# Patient Record
Sex: Male | Born: 1965 | Race: White | Hispanic: No | Smoking: Current every day smoker
Health system: Southern US, Community
[De-identification: ages and names within clinical notes are randomized; demographics above are authoritative.]

## PROBLEM LIST (undated history)

## (undated) DIAGNOSIS — K920 Hematemesis: Secondary | ICD-10-CM

## (undated) DIAGNOSIS — K469 Unspecified abdominal hernia without obstruction or gangrene: Secondary | ICD-10-CM

## (undated) DIAGNOSIS — F32A Depression, unspecified: Secondary | ICD-10-CM

## (undated) DIAGNOSIS — K766 Portal hypertension: Secondary | ICD-10-CM

## (undated) DIAGNOSIS — E785 Hyperlipidemia, unspecified: Secondary | ICD-10-CM

## (undated) DIAGNOSIS — F909 Attention-deficit hyperactivity disorder, unspecified type: Secondary | ICD-10-CM

## (undated) DIAGNOSIS — R0602 Shortness of breath: Secondary | ICD-10-CM

## (undated) DIAGNOSIS — K7460 Unspecified cirrhosis of liver: Secondary | ICD-10-CM

## (undated) DIAGNOSIS — N200 Calculus of kidney: Secondary | ICD-10-CM

## (undated) DIAGNOSIS — Z87442 Personal history of urinary calculi: Secondary | ICD-10-CM

## (undated) DIAGNOSIS — R319 Hematuria, unspecified: Secondary | ICD-10-CM

## (undated) DIAGNOSIS — K921 Melena: Secondary | ICD-10-CM

## (undated) DIAGNOSIS — D61818 Other pancytopenia: Secondary | ICD-10-CM

## (undated) DIAGNOSIS — E119 Type 2 diabetes mellitus without complications: Secondary | ICD-10-CM

## (undated) DIAGNOSIS — J189 Pneumonia, unspecified organism: Secondary | ICD-10-CM

## (undated) DIAGNOSIS — R519 Headache, unspecified: Secondary | ICD-10-CM

## (undated) DIAGNOSIS — K279 Peptic ulcer, site unspecified, unspecified as acute or chronic, without hemorrhage or perforation: Secondary | ICD-10-CM

## (undated) DIAGNOSIS — R7303 Prediabetes: Secondary | ICD-10-CM

## (undated) DIAGNOSIS — I8500 Esophageal varices without bleeding: Secondary | ICD-10-CM

## (undated) DIAGNOSIS — I81 Portal vein thrombosis: Secondary | ICD-10-CM

## (undated) DIAGNOSIS — D376 Neoplasm of uncertain behavior of liver, gallbladder and bile ducts: Secondary | ICD-10-CM

## (undated) DIAGNOSIS — F419 Anxiety disorder, unspecified: Secondary | ICD-10-CM

## (undated) HISTORY — DX: Neoplasm of uncertain behavior of liver, gallbladder and bile ducts: D37.6

## (undated) HISTORY — DX: Unspecified cirrhosis of liver: K74.60

## (undated) HISTORY — DX: Anxiety disorder, unspecified: F41.9

## (undated) HISTORY — DX: Calculus of kidney: N20.0

## (undated) HISTORY — DX: Depression, unspecified: F32.A

## (undated) HISTORY — DX: Melena: K92.1

## (undated) HISTORY — PX: URETHRAL STRICTURE DILATATION: SHX477

## (undated) HISTORY — DX: Pneumonia, unspecified organism: J18.9

## (undated) HISTORY — PX: CIRCUMCISION: SUR203

## (undated) HISTORY — DX: Peptic ulcer, site unspecified, unspecified as acute or chronic, without hemorrhage or perforation: K27.9

## (undated) HISTORY — DX: Other pancytopenia: D61.818

## (undated) HISTORY — DX: Unspecified abdominal hernia without obstruction or gangrene: K46.9

## (undated) HISTORY — PX: TONSILLECTOMY: SUR1361

## (undated) HISTORY — DX: Shortness of breath: R06.02

## (undated) HISTORY — PX: OTHER SURGICAL HISTORY: SHX169

## (undated) HISTORY — DX: Type 2 diabetes mellitus without complications: E11.9

## (undated) HISTORY — DX: Attention-deficit hyperactivity disorder, unspecified type: F90.9

## (undated) HISTORY — DX: Portal hypertension: K76.6

## (undated) HISTORY — DX: Headache, unspecified: R51.9

## (undated) HISTORY — DX: Hematuria, unspecified: R31.9

## (undated) HISTORY — DX: Hematemesis: K92.0

## (undated) HISTORY — DX: Portal vein thrombosis: I81

## (undated) HISTORY — DX: Hyperlipidemia, unspecified: E78.5

---

## 2021-04-05 DIAGNOSIS — E663 Overweight: Secondary | ICD-10-CM | POA: Diagnosis not present

## 2021-04-05 DIAGNOSIS — E785 Hyperlipidemia, unspecified: Secondary | ICD-10-CM | POA: Diagnosis not present

## 2021-04-05 DIAGNOSIS — K439 Ventral hernia without obstruction or gangrene: Secondary | ICD-10-CM | POA: Diagnosis not present

## 2021-04-05 DIAGNOSIS — E1169 Type 2 diabetes mellitus with other specified complication: Secondary | ICD-10-CM | POA: Diagnosis not present

## 2021-05-20 DIAGNOSIS — R161 Splenomegaly, not elsewhere classified: Secondary | ICD-10-CM | POA: Diagnosis not present

## 2021-05-20 DIAGNOSIS — R160 Hepatomegaly, not elsewhere classified: Secondary | ICD-10-CM | POA: Diagnosis not present

## 2021-05-20 DIAGNOSIS — R188 Other ascites: Secondary | ICD-10-CM | POA: Diagnosis not present

## 2021-05-20 DIAGNOSIS — K7689 Other specified diseases of liver: Secondary | ICD-10-CM | POA: Diagnosis not present

## 2021-05-20 DIAGNOSIS — K7460 Unspecified cirrhosis of liver: Secondary | ICD-10-CM | POA: Diagnosis not present

## 2021-06-01 DIAGNOSIS — R188 Other ascites: Secondary | ICD-10-CM | POA: Diagnosis not present

## 2021-06-01 DIAGNOSIS — I81 Portal vein thrombosis: Secondary | ICD-10-CM | POA: Diagnosis not present

## 2021-06-01 DIAGNOSIS — R748 Abnormal levels of other serum enzymes: Secondary | ICD-10-CM | POA: Diagnosis not present

## 2021-06-01 DIAGNOSIS — K7469 Other cirrhosis of liver: Secondary | ICD-10-CM | POA: Diagnosis not present

## 2021-06-01 DIAGNOSIS — Z1159 Encounter for screening for other viral diseases: Secondary | ICD-10-CM | POA: Diagnosis not present

## 2021-06-01 DIAGNOSIS — D376 Neoplasm of uncertain behavior of liver, gallbladder and bile ducts: Secondary | ICD-10-CM | POA: Diagnosis not present

## 2021-06-07 ENCOUNTER — Encounter: Payer: Self-pay | Admitting: Gastroenterology

## 2021-06-15 DIAGNOSIS — R188 Other ascites: Secondary | ICD-10-CM | POA: Diagnosis not present

## 2021-06-15 DIAGNOSIS — D61818 Other pancytopenia: Secondary | ICD-10-CM | POA: Diagnosis not present

## 2021-06-15 DIAGNOSIS — K7469 Other cirrhosis of liver: Secondary | ICD-10-CM | POA: Diagnosis not present

## 2021-06-17 ENCOUNTER — Encounter: Payer: Self-pay | Admitting: Gastroenterology

## 2021-06-24 ENCOUNTER — Ambulatory Visit: Payer: Self-pay | Admitting: Gastroenterology

## 2021-07-01 ENCOUNTER — Other Ambulatory Visit: Payer: Self-pay | Admitting: Nurse Practitioner

## 2021-07-01 DIAGNOSIS — D376 Neoplasm of uncertain behavior of liver, gallbladder and bile ducts: Secondary | ICD-10-CM

## 2021-07-11 ENCOUNTER — Other Ambulatory Visit: Payer: Self-pay

## 2021-07-12 NOTE — Progress Notes (Deleted)
[facility]  [address] [place],  [place]  [address] [phone]  Clinic Day:  07/12/2021  Referring physician: Emmaline Kluver, *  ASSESSMENT & PLAN:   Assessment & Plan: No problem-specific Assessment & Plan notes found for this encounter.    The patient understands the plans discussed today and is in agreement with them.  He knows to contact our office if he develops concerns prior to his next appointment.   I provided *** minutes of face-to-face time during this encounter and > 50% was spent counseling as documented under my assessment and plan.    Melodye Ped, NP  [facility] [address] [place] [address] Dept: [phone] Dept Fax: [phone]   No orders of the defined types were placed in this encounter.     CHIEF COMPLAINT:  CC: ***  Current Treatment:  ***  HISTORY OF PRESENT ILLNESS:   Oncology History   No history exists.      INTERVAL HISTORY:  Colton Leon is here today for repeat clinical assessment. He denies fevers or chills. He denies pain. His appetite is good. His weight {Weight change:10426}.  REVIEW OF SYSTEMS:  Review of Systems - Oncology   VITALS:  There were no vitals taken for this visit.  Wt Readings from Last 3 Encounters:  No data found for Wt    There is no height or weight on file to calculate BMI.  Performance status (ECOG): {CHL ONC [id number]  PHYSICAL EXAM:  Physical Exam  LABS:  No flowsheet data found. No flowsheet data found.   No results found for: CEA1 / No results found for: CEA1 No results found for: PSA1 No results found for: SEG315 No results found for: CAN125  No results found for: TOTALPROTELP, ALBUMINELP, A1GS, A2GS, BETS, BETA2SER, GAMS, MSPIKE, SPEI No results found for: TIBC, FERRITIN, IRONPCTSAT No results found for: LDH  STUDIES:  No results found.    HISTORY:   Past  Medical History:  Diagnosis Date   Cirrhosis ([facility])    Hyperlipidemia    Neoplasm of uncertain behavior of liver    Portal hypertension ([facility])    Portal vein thrombosis    Type 2 diabetes mellitus ([facility])     *** The histories are not reviewed yet. Please review them in the "History" navigator section and refresh this [place].  Family History  Problem Relation Age of Onset   Hypertension Mother    Skin cancer Father    Emphysema Father    Diabetes Sister    Hypertension Sister     Social History:  has no history on file for tobacco use, alcohol use, and drug use.The patient is {Blank single:19197::"alone","accompanied by"} *** today.  Allergies:  Allergies  Allergen Reactions   Amoxicillin-Pot Clavulanate Shortness Of Breath   Shellfish Allergy Shortness Of Breath    Current Medications: Current Outpatient Medications  Medication Sig Dispense Refill   acetaminophen (TYLENOL) 325 MG tablet Take 650 mg by mouth every 6 (six) hours as needed.     ferrous sulfate 325 (65 FE) MG tablet Take by mouth.     furosemide (LASIX) 20 MG tablet Take by mouth.     spironolactone (ALDACTONE) 50 MG tablet Take by mouth.     traMADol (ULTRAM) 50 MG tablet Take by mouth every 6 (six) hours as needed.     No current facility-administered medications for this visit.

## 2021-07-12 NOTE — Progress Notes (Cosign Needed)
[facility]  [address] [place],  [place]  [address] [phone]  Clinic Day:  07/12/2021  Referring physician: Emmaline Kluver, *   REASON FOR CONSULTATION:  Pancytopenia  HISTORY OF PRESENT ILLNESS:  Colton Leon is a 56 y.o. male with a history of pancytopenia who is referred in consultation by Christa See for assessment and management. Colton Leon originally presented in November 2022 for a work related abdominal injury. He was evaluated by ultrasound which revealed recanalization of the umbilical vein with nonocclusive thrombus of the portal splenic confluence, splenomegaly and trace ascites. CT of the abdomen revealed no findings related to injury, but reported a cirrhotic appearance to the liver with evidence of portal hypertension including splenomegaly and paraesophageal varices, concerning for nonocclusive portal vein thrombus, small amount of abdominal ascites, and subcentimeter lesion within the right hepatic lobe of the liver.   He then underwent MRI of the abdomen with and without contrast on 05/20/2021 and was noted to have a 2.7 cm lesion within segment 4 of the liver with early arterial enhancement and late phase suggestion of washout and pseudocapsule although it is noted that the imaging was of poor quality, the lesion was highly concerning for hepatocellular carcinoma. Recommendation was for repeat MRI imaging. Unfortunately, he lost insurance around this time and was unable to pursue further imaging.   He established insurance and was evaluated by [facility] in [place]. Serologic evaluation was negative for viral, autoimmune, or genetic liver disease. He has no immunity to hepatitis A or B. He was also noted to have pancytopenia with WBC 1.8 and Hgb 7.3. He was noted to have iron deficiency anemia with ferritin of 5 and saturation of 10%. He was started on oral iron at that time. AFP in January  2023 was 3.3, indirect bilirubin 1.6, INR of 1.2 with alkaline phosphatase/ AST/ ALT within normal limits. In response to his ascites, he was started on furosemide 40 mg daily and spironolactone 100 mg daily.   Today he presents with his girlfriend for continued evaluation. He denies fever, chills, nausea or vomiting. He denies abdominal pain or increase in ascites. He denies issue with bowel or bladder. Medical history is significant for blood in stool, ulcer, type 2 diabetes, hyperlipidemia, other cirrhosis of liver, ascites, portal hypertension and portal vein thrombosis. Past surgical history consists of a tonsillectomy. Family history is significant for hypertension, skin cancer, bipolar disorder, emphysema, diabetes and mental health disorder. He is currently unemployed having just lost his job. He worked as a Administrator. He smokes daily 1-1/2 packs per day. He denies alcohol or substance use. He is divorced with 2 children and lives with his girlfriend.    REVIEW OF SYSTEMS:  Review of Systems  Constitutional:  Negative for appetite change, chills, diaphoresis, fatigue, fever and unexpected weight change.  HENT:   Negative for hearing loss, lump/mass, mouth sores, nosebleeds, sore throat, tinnitus, trouble swallowing and voice change.   Eyes:  Negative for eye problems and icterus.  Respiratory:  Negative for chest tightness, cough, hemoptysis, shortness of breath and wheezing.   Cardiovascular:  Negative for chest pain, leg swelling and palpitations.  Gastrointestinal:  Positive for abdominal distention. Negative for abdominal pain, blood in stool, constipation, diarrhea, nausea, rectal pain and vomiting.  Endocrine: Negative for hot flashes.  Genitourinary:  Negative for bladder incontinence, difficulty urinating, dyspareunia, dysuria, frequency, hematuria and nocturia.   Musculoskeletal:  Negative for arthralgias, back pain, flank  pain, gait problem, myalgias, neck pain and neck stiffness.   Skin:  Negative for itching, rash and wound.  Neurological:  Negative for dizziness, extremity weakness, gait problem, headaches, light-headedness, numbness, seizures and speech difficulty.  Hematological:  Negative for adenopathy. Does not bruise/bleed easily.  Psychiatric/Behavioral:  Negative for confusion, decreased concentration, depression, sleep disturbance and suicidal ideas. The patient is not nervous/anxious.     VITALS:  There were no vitals taken for this visit.  Wt Readings from Last 3 Encounters:  No data found for Wt    There is no height or weight on file to calculate BMI.  Performance status (ECOG): 1 - Symptomatic but completely ambulatory  PHYSICAL EXAM:  Physical Exam Constitutional:      General: He is not in acute distress.    Appearance: Normal appearance. He is normal weight. He is not ill-appearing, toxic-appearing or diaphoretic.  HENT:     Head: Normocephalic and atraumatic.     Right Ear: Tympanic membrane normal.     Left Ear: Tympanic membrane normal.     Nose: Nose normal. No congestion or rhinorrhea.     Mouth/Throat:     Mouth: Mucous membranes are moist.     Pharynx: Oropharynx is clear. No oropharyngeal exudate or posterior oropharyngeal erythema.  Eyes:     General: No scleral icterus.       Right eye: No discharge.        Left eye: No discharge.     Extraocular Movements: Extraocular movements intact.     Conjunctiva/sclera: Conjunctivae normal.     Pupils: Pupils are equal, round, and reactive to light.  Neck:     Vascular: No carotid bruit.  Cardiovascular:     Rate and Rhythm: Normal rate and regular rhythm.     Heart sounds: No murmur heard.   No friction rub. No gallop.  Pulmonary:     Effort: Pulmonary effort is normal. No respiratory distress.     Breath sounds: Normal breath sounds. No stridor. No wheezing, rhonchi or rales.  Chest:     Chest wall: No tenderness.  Abdominal:     General: Abdomen is flat. Bowel sounds are  normal. There is distension.     Palpations: There is no mass.     Tenderness: There is no abdominal tenderness. There is no right CVA tenderness, left CVA tenderness, guarding or rebound.     Hernia: No hernia is present.  Musculoskeletal:        General: No swelling, tenderness, deformity or signs of injury. Normal range of motion.     Cervical back: Normal range of motion and neck supple. No rigidity or tenderness.     Right lower leg: No edema.     Left lower leg: No edema.  Lymphadenopathy:     Cervical: No cervical adenopathy.  Skin:    General: Skin is warm and dry.     Capillary Refill: Capillary refill takes less than 2 seconds.     Coloration: Skin is not jaundiced or pale.     Findings: No bruising, erythema, lesion or rash.  Neurological:     General: No focal deficit present.     Mental Status: He is alert and oriented to person, place, and time. Mental status is at baseline.     Cranial Nerves: No cranial nerve deficit.     Sensory: No sensory deficit.     Motor: No weakness.     Coordination: Coordination normal.     Gait:  Gait normal.     Deep Tendon Reflexes: Reflexes normal.  Psychiatric:        Mood and Affect: Mood normal.        Behavior: Behavior normal.        Thought Content: Thought content normal.        Judgment: Judgment normal.     LABS:  No flowsheet data found. No flowsheet data found.   No results found for: CEA1 / No results found for: CEA1 No results found for: PSA1 No results found for: [id number] No results found for: CAN125  No results found for: TOTALPROTELP, ALBUMINELP, A1GS, A2GS, BETS, BETA2SER, GAMS, MSPIKE, SPEI No results found for: TIBC, FERRITIN, IRONPCTSAT No results found for: LDH  STUDIES:  No results found.    HISTORY:   Past Medical History:  Diagnosis Date   Cirrhosis ([facility])    Hyperlipidemia    Neoplasm of uncertain behavior of liver    Portal hypertension ([facility])    Portal vein thrombosis    Type 2 diabetes  mellitus ([facility])       Family History  Problem Relation Age of Onset   Hypertension Mother    Skin cancer Father    Emphysema Father    Diabetes Sister    Hypertension Sister     Social History:  has no history on file for tobacco use, alcohol use, and drug use.The patient is accompanied by girlfriend today.  Allergies:  Allergies  Allergen Reactions   Amoxicillin-Pot Clavulanate Shortness Of Breath   Shellfish Allergy Shortness Of Breath    Current Medications: Current Outpatient Medications  Medication Sig Dispense Refill   acetaminophen (TYLENOL) 325 MG tablet Take 650 mg by mouth every 6 (six) hours as needed.     ferrous sulfate 325 (65 FE) MG tablet Take by mouth.     furosemide (LASIX) 20 MG tablet Take by mouth.     spironolactone (ALDACTONE) 50 MG tablet Take by mouth.     traMADol (ULTRAM) 50 MG tablet Take by mouth every 6 (six) hours as needed.     No current facility-administered medications for this visit.     ASSESSMENT & PLAN:   Assessment:  Colton Leon is a 56 y.o. male with history of pancytopenia and liver lesion. Today, his hemoglobin has improved from 7.3 to 9.1 on oral iron. He remains with microcytic anemia today. White count remains 1.8 and platelets are 53 today. He was recommended for repeat MRI of the abdomen for better evaluation of liver lesion and this is scheduled on 03/07 as well as an EGD. We discussed the many causes of his low blood counts including anemia, liver disease or bone marrow dysfunction. If MRI of the abdomen indicates a concern for malignancy, the next step would be biopsy. We also discussed that at some point his bone marrow may require evaluation. Both the patient and his girlfriend understand that, unfortunately we still have a lot of unanswered questions. They are anxious to continue with diagnostics and move toward a treatment plan.   Plan: 1.  We will wait for results of upcoming imaging and he will return to clinic  in 3 weeks for discussion of next steps in treatment planning.   I discussed the assessment and treatment plan with the patient.  The patient was provided an opportunity to ask questions and all were answered.  The patient agreed with the plan and demonstrated an understanding of the instructions.  The patient was advised to call back  if the symptoms worsen or if the condition fails to improve as anticipated.  Thank you for the opportunity      Melodye Ped, NP

## 2021-07-13 ENCOUNTER — Encounter: Payer: Self-pay | Admitting: Hematology and Oncology

## 2021-07-13 ENCOUNTER — Encounter (INDEPENDENT_AMBULATORY_CARE_PROVIDER_SITE_OTHER): Payer: Self-pay

## 2021-07-13 ENCOUNTER — Other Ambulatory Visit: Payer: Self-pay

## 2021-07-13 ENCOUNTER — Inpatient Hospital Stay: Payer: Managed Care, Other (non HMO)

## 2021-07-13 ENCOUNTER — Other Ambulatory Visit: Payer: Self-pay | Admitting: Hematology and Oncology

## 2021-07-13 ENCOUNTER — Inpatient Hospital Stay: Payer: Managed Care, Other (non HMO) | Attending: Hematology and Oncology | Admitting: Hematology and Oncology

## 2021-07-13 VITALS — BP 125/61 | HR 96 | Resp 20 | Ht 66.0 in | Wt 169.2 lb

## 2021-07-13 DIAGNOSIS — K769 Liver disease, unspecified: Secondary | ICD-10-CM | POA: Diagnosis present

## 2021-07-13 DIAGNOSIS — D61818 Other pancytopenia: Secondary | ICD-10-CM | POA: Diagnosis not present

## 2021-07-13 DIAGNOSIS — R161 Splenomegaly, not elsewhere classified: Secondary | ICD-10-CM | POA: Diagnosis not present

## 2021-07-13 DIAGNOSIS — E119 Type 2 diabetes mellitus without complications: Secondary | ICD-10-CM

## 2021-07-13 DIAGNOSIS — I81 Portal vein thrombosis: Secondary | ICD-10-CM

## 2021-07-13 DIAGNOSIS — K7460 Unspecified cirrhosis of liver: Secondary | ICD-10-CM

## 2021-07-13 DIAGNOSIS — K766 Portal hypertension: Secondary | ICD-10-CM

## 2021-07-13 LAB — HEPATIC FUNCTION PANEL
ALT: 22 (ref 10–40)
AST: 37 (ref 14–40)
Alkaline Phosphatase: 80 (ref 25–125)
Bilirubin, Total: 1.6

## 2021-07-13 LAB — CBC: RBC: 3.73 — AB (ref 3.87–5.11)

## 2021-07-13 LAB — BASIC METABOLIC PANEL
BUN: 16 (ref 4–21)
CO2: 28 — AB (ref 13–22)
Chloride: 101 (ref 99–108)
Creatinine: 0.6 (ref 0.6–1.3)
Glucose: 297
Potassium: 4.3 (ref 3.4–5.3)
Sodium: 137 (ref 137–147)

## 2021-07-13 LAB — IRON AND TIBC
Iron: 231 ug/dL — ABNORMAL HIGH (ref 45–182)
Saturation Ratios: 47 % — ABNORMAL HIGH (ref 17.9–39.5)
TIBC: 495 ug/dL — ABNORMAL HIGH (ref 250–450)
UIBC: 264 ug/dL

## 2021-07-13 LAB — CBC AND DIFFERENTIAL
HCT: 29 — AB (ref 41–53)
Hemoglobin: 9.1 — AB (ref 13.5–17.5)
Neutrophils Absolute: 0.98
Platelets: 53 — AB (ref 150–399)
WBC: 1.6

## 2021-07-13 LAB — FERRITIN: Ferritin: 7 ng/mL — ABNORMAL LOW (ref 24–336)

## 2021-07-13 LAB — COMPREHENSIVE METABOLIC PANEL
Albumin: 4.1 (ref 3.5–5.0)
Calcium: 8.7 (ref 8.7–10.7)

## 2021-07-13 LAB — FOLATE: Folate: 9.9 ng/mL (ref >5.9)

## 2021-07-13 LAB — VITAMIN B12: Vitamin B-12: 567 pg/mL (ref 180–914)

## 2021-07-25 NOTE — Progress Notes (Incomplete)
[facility]  [address] [place],  [place]  [address] [phone]  Clinic Day:  07/25/2021  Referring physician: Venetia Maxon, [place], *  This document serves as a record of services personally performed by Marice Potter, MD. It was created on their behalf by Curry,Lauren E, a trained medical scribe. The creation of this record is based on the scribe's personal observations and the provider's statements to them.  REASON FOR CONSULTATION:  Pancytopenia  HISTORY OF PRESENT ILLNESS:  Colton Leon is a 56 y.o. male with a history of pancytopenia who is referred in consultation by Christa See for assessment and management. Colton Leon originally presented in November 2022 for a work related abdominal injury. He was evaluated by ultrasound which revealed recanalization of the umbilical vein with nonocclusive thrombus of the portal splenic confluence, splenomegaly and trace ascites. CT of the abdomen revealed no findings related to injury, but reported a cirrhotic appearance to the liver with evidence of portal hypertension including splenomegaly and paraesophageal varices, concerning for nonocclusive portal vein thrombus, small amount of abdominal ascites, and subcentimeter lesion within the right hepatic lobe of the liver.   He then underwent MRI of the abdomen with and without contrast on 05/20/2021 and was noted to have a 2.7 cm lesion within segment 4 of the liver with early arterial enhancement and late phase suggestion of washout and pseudocapsule although it is noted that the imaging was of poor quality, the lesion was highly concerning for hepatocellular carcinoma. Recommendation was for repeat MRI imaging. Unfortunately, he lost insurance around this time and was unable to pursue further imaging.   He established insurance and was evaluated by [facility] in [place]. Serologic evaluation was negative for viral,  autoimmune, or genetic liver disease. He has no immunity to hepatitis A or B. He was also noted to have pancytopenia with WBC 1.8 and Hgb 7.3. He was noted to have iron deficiency anemia with ferritin of 5 and saturation of 10%. He was started on oral iron at that time. AFP in January 2023 was 3.3, indirect bilirubin 1.6, INR of 1.2 with alkaline phosphatase/ AST/ ALT within normal limits. In response to his ascites, he was started on furosemide 40 mg daily and spironolactone 100 mg daily.   Today he presents with his girlfriend for continued evaluation. He denies fever, chills, nausea or vomiting. He denies abdominal pain or increase in ascites. He denies issue with bowel or bladder. Medical history is significant for blood in stool, ulcer, type 2 diabetes, hyperlipidemia, other cirrhosis of liver, ascites, portal hypertension and portal vein thrombosis. Past surgical history consists of a tonsillectomy. Family history is significant for hypertension, skin cancer, bipolar disorder, emphysema, diabetes and mental health disorder. He is currently unemployed having just lost his job. He worked as a Administrator. He smokes daily 1-1/2 packs per day. He denies alcohol or substance use. He is divorced with 2 children and lives with his girlfriend.    REVIEW OF SYSTEMS:  Review of Systems  Constitutional:  Negative for appetite change, chills, diaphoresis, fatigue, fever and unexpected weight change.  HENT:   Negative for hearing loss, lump/mass, mouth sores, nosebleeds, sore throat, tinnitus, trouble swallowing and voice change.   Eyes:  Negative for eye problems and icterus.  Respiratory:  Negative for chest tightness, cough, hemoptysis, shortness of breath and wheezing.   Cardiovascular:  Negative for chest pain, leg swelling and palpitations.  Gastrointestinal:  Positive for abdominal  distention. Negative for abdominal pain, blood in stool, constipation, diarrhea, nausea, rectal pain and vomiting.   Endocrine: Negative for hot flashes.  Genitourinary:  Negative for bladder incontinence, difficulty urinating, dyspareunia, dysuria, frequency, hematuria and nocturia.   Musculoskeletal:  Negative for arthralgias, back pain, flank pain, gait problem, myalgias, neck pain and neck stiffness.  Skin:  Negative for itching, rash and wound.  Neurological:  Negative for dizziness, extremity weakness, gait problem, headaches, light-headedness, numbness, seizures and speech difficulty.  Hematological:  Negative for adenopathy. Does not bruise/bleed easily.  Psychiatric/Behavioral:  Negative for confusion, decreased concentration, depression, sleep disturbance and suicidal ideas. The patient is not nervous/anxious.     VITALS:  There were no vitals taken for this visit.  Wt Readings from Last 3 Encounters:  07/13/21 169 lb 3.2 oz (76.7 kg)    There is no height or weight on file to calculate BMI.  Performance status (ECOG): 1 - Symptomatic but completely ambulatory  PHYSICAL EXAM:  Physical Exam Constitutional:      General: He is not in acute distress.    Appearance: Normal appearance. He is normal weight. He is not ill-appearing, toxic-appearing or diaphoretic.  HENT:     Head: Normocephalic and atraumatic.     Right Ear: Tympanic membrane normal.     Left Ear: Tympanic membrane normal.     Nose: Nose normal. No congestion or rhinorrhea.     Mouth/Throat:     Mouth: Mucous membranes are moist.     Pharynx: Oropharynx is clear. No oropharyngeal exudate or posterior oropharyngeal erythema.  Eyes:     General: No scleral icterus.       Right eye: No discharge.        Left eye: No discharge.     Extraocular Movements: Extraocular movements intact.     Conjunctiva/sclera: Conjunctivae normal.     Pupils: Pupils are equal, round, and reactive to light.  Neck:     Vascular: No carotid bruit.  Cardiovascular:     Rate and Rhythm: Normal rate and regular rhythm.     Heart sounds: No murmur  heard.   No friction rub. No gallop.  Pulmonary:     Effort: Pulmonary effort is normal. No respiratory distress.     Breath sounds: Normal breath sounds. No stridor. No wheezing, rhonchi or rales.  Chest:     Chest wall: No tenderness.  Abdominal:     General: Abdomen is flat. Bowel sounds are normal. There is distension.     Palpations: There is no mass.     Tenderness: There is no abdominal tenderness. There is no right CVA tenderness, left CVA tenderness, guarding or rebound.     Hernia: No hernia is present.  Musculoskeletal:        General: No swelling, tenderness, deformity or signs of injury. Normal range of motion.     Cervical back: Normal range of motion and neck supple. No rigidity or tenderness.     Right lower leg: No edema.     Left lower leg: No edema.  Lymphadenopathy:     Cervical: No cervical adenopathy.  Skin:    General: Skin is warm and dry.     Capillary Refill: Capillary refill takes less than 2 seconds.     Coloration: Skin is not jaundiced or pale.     Findings: No bruising, erythema, lesion or rash.  Neurological:     General: No focal deficit present.     Mental Status: He is alert and  oriented to person, place, and time. Mental status is at baseline.     Cranial Nerves: No cranial nerve deficit.     Sensory: No sensory deficit.     Motor: No weakness.     Coordination: Coordination normal.     Gait: Gait normal.     Deep Tendon Reflexes: Reflexes normal.  Psychiatric:        Mood and Affect: Mood normal.        Behavior: Behavior normal.        Thought Content: Thought content normal.        Judgment: Judgment normal.     LABS:   CBC Latest Ref Rng & Units 07/13/2021  WBC - 1.6  Hemoglobin 13.5 - 17.5 9.1(A)  Hematocrit 41 - 53 29(A)  Platelets 150 - 399 53(A)   CMP Latest Ref Rng & Units 07/13/2021  BUN 4 - 21 16  Creatinine 0.6 - 1.3 0.6  Sodium 137 - 147 137  Potassium 3.4 - 5.3 4.3  Chloride 99 - 108 101  CO2 13 - 22 28(A)   Calcium 8.7 - 10.7 8.7  Alkaline Phos 25 - 125 80  AST 14 - 40 37  ALT 10 - 40 22     No results found for: CEA1 / No results found for: CEA1 No results found for: PSA1 No results found for: OXB353 No results found for: CAN125  No results found for: TOTALPROTELP, ALBUMINELP, A1GS, A2GS, BETS, BETA2SER, GAMS, MSPIKE, SPEI Lab Results  Component Value Date   TIBC 495 (H) 07/13/2021   FERRITIN 7 (L) 07/13/2021   IRONPCTSAT 47 (H) 07/13/2021   No results found for: LDH  STUDIES:  No results found.    HISTORY:   Past Medical History:  Diagnosis Date   ADHD    Anxiety and depression    Blood in stool    hx of   Cirrhosis ([place])    Headache    Hematuria    hx of   Hernia of abdominal cavity    Hyperlipidemia    Neoplasm of uncertain behavior of liver    Neoplasm of uncertain behavior of liver    Pancytopenia ([facility])    Portal hypertension ([facility])    Portal vein thrombosis    Shortness of breath on exertion    Symptom of blood in vomit    hx of   Type 2 diabetes mellitus ([place])       Family History  Problem Relation Age of Onset   Hypertension Mother    Skin cancer Father    Emphysema Father    Bipolar disorder Father    Diabetes Sister    Hypertension Sister    Autism spectrum disorder Daughter     Social History:  reports that he has been smoking cigarettes. He has a 57.00 pack-year smoking history. He has never used smokeless tobacco. He reports that he does not drink alcohol and does not use drugs.The patient is accompanied by girlfriend today.  Allergies:  Allergies  Allergen Reactions   Amoxicillin-Pot Clavulanate Shortness Of Breath   Shellfish Allergy Shortness Of Breath    Current Medications: Current Outpatient Medications  Medication Sig Dispense Refill   acetaminophen (TYLENOL) 325 MG tablet Take 650 mg by mouth every 6 (six) hours as needed.     ferrous sulfate 325 (65 FE) MG tablet Take by mouth.     furosemide (LASIX) 20 MG tablet Take by  mouth.     spironolactone (ALDACTONE) 50 MG  tablet Take by mouth.     traMADol (ULTRAM) 50 MG tablet Take by mouth every 6 (six) hours as needed. (Patient not taking: Reported on 07/13/2021)     No current facility-administered medications for this visit.     ASSESSMENT & PLAN:   Assessment:  Colton Leon is a 56 y.o. male with history of pancytopenia and liver lesion. Today, his hemoglobin has improved from 7.3 to 9.1 on oral iron. He remains with microcytic anemia today. White count remains 1.8 and platelets are 53 today. He was recommended for repeat MRI of the abdomen for better evaluation of liver lesion and this is scheduled on 03/07 as well as an EGD. We discussed the many causes of his low blood counts including anemia, liver disease or bone marrow dysfunction. If MRI of the abdomen indicates a concern for malignancy, the next step would be biopsy. We also discussed that at some point his bone marrow may require evaluation. Both the patient and his girlfriend understand that, unfortunately we still have a lot of unanswered questions. They are anxious to continue with diagnostics and move toward a treatment plan.   Plan: 1.  We will wait for results of upcoming imaging and he will return to clinic in 3 weeks for discussion of next steps in treatment planning.   I discussed the assessment and treatment plan with the patient.  The patient was provided an opportunity to ask questions and all were answered.  The patient agreed with the plan and demonstrated an understanding of the instructions.  The patient was advised to call back if the symptoms worsen or if the condition fails to improve as anticipated.   I, Rita Ohara, am acting as scribe for Marice Potter, MD    I have reviewed this report as typed by the medical scribe, and it is complete and accurate.  Dequincy Macarthur Critchley, MD

## 2021-07-26 ENCOUNTER — Ambulatory Visit: Payer: Managed Care, Other (non HMO)

## 2021-07-26 ENCOUNTER — Encounter: Payer: Self-pay | Admitting: Gastroenterology

## 2021-07-26 ENCOUNTER — Ambulatory Visit (INDEPENDENT_AMBULATORY_CARE_PROVIDER_SITE_OTHER): Payer: Commercial Managed Care - HMO | Admitting: Gastroenterology

## 2021-07-26 VITALS — BP 110/60 | HR 100 | Ht 66.0 in | Wt 169.0 lb

## 2021-07-26 DIAGNOSIS — K7460 Unspecified cirrhosis of liver: Secondary | ICD-10-CM

## 2021-07-26 DIAGNOSIS — R188 Other ascites: Secondary | ICD-10-CM

## 2021-07-26 DIAGNOSIS — K7581 Nonalcoholic steatohepatitis (NASH): Secondary | ICD-10-CM | POA: Diagnosis not present

## 2021-07-26 NOTE — Patient Instructions (Addendum)
If you are age 55 or younger, your body mass index should be between 19-25. Your Body mass index is 27.28 kg/m?Marland Kitchen If this is out of the aformentioned range listed, please consider follow up with your Primary Care Provider.  ?________________________________________________________ ? ?The [facility] providers would like to encourage you to use [facility] to communicate with providers for non-urgent requests or questions.  Due to long hold times on the telephone, sending your provider a message by [facility] may be a faster and more efficient way to get a response.  Please allow 48 business hours for a response.  Please remember that this is for non-urgent requests.  ?_______________________________________________________ ? ?You have been scheduled for an endoscopy. Please follow written instructions given to you at your visit today. ?If you use inhalers (even only as needed), please bring them with you on the day of your procedure. ? ?Due to recent changes in healthcare laws, you may see the results of your imaging and laboratory studies on MyChart before your provider has had a chance to review them.  We understand that in some cases there may be results that are confusing or concerning to you. Not all laboratory results come back in the same time frame and the provider may be waiting for multiple results in order to interpret others.  Please give [place] 48 hours in order for your provider to thoroughly review all the results before contacting the office for clarification of your results.  ? ?Thank you for entrusting me with your care and choosing [facility]. ? ?Dr Candis Schatz ?

## 2021-07-26 NOTE — Progress Notes (Signed)
HPI : Colton Leon is a pleasant 56 year old male with a history of diabetes, anxiety and depression with newly diagnosed cirrhosis who is referred to [place] by Annamarie Major, NP from [facility] for variceal screening.  The patient was first found to be cirrhotic in November 2022 when he underwent an ultrasound as part of an evaluation for abdominal injury and Worker's compensation claim.  The ultrasound incidentally showed recanalization of the umbilical vein with nonocclusive thrombus with some portal splenic confluence, splenomegaly and trace ascites.  A subsequent CT of the abdomen showed a cirrhotic appearing liver with evidence of portal hypertension to include splenomegaly, paraesophageal varices, nonocclusive portal vein thrombus and small amount of abdominal ascites, as well as a subcentimeter lesion within the right lobe of the liver.  A subsequent MRI in December 2022 showed a 2.7 cm lesion concerning for hepatocellular carcinoma.  This image was of poor quality, and a repeat MRI is scheduled for today.  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 He was referred to [place] to perform esophageal variceal screening and initial screening colonoscopy.  He has never had an upper or lower endoscopy before.  He does have pancytopenia, and was sent to the emergency department 3 weeks ago because of a hemoglobin of 6.  He was given 1 unit packed red blood cells in the emergency department and discharged home.  He has been taking oral iron, and his hemoglobin has increased (most recent 9.1 on February 22).  Most recent platelets 53 and white blood cell count 1.6. He was seen by hematology/oncology on February 22 and has plans for follow-up following his repeat MRI with potential plans for biopsy of liver lesion and bone marrow biopsy due to severe pancytopenia.  The patient denies any symptoms of GI blood loss to include black tarry stools or hematochezia.  He does have a remote  history of hematemesis, but this has not happened in years.  He has had issues with fluctuating appetite and unintentional weight loss (reports losing 30 pounds in the past 30 days).  He denies abdominal pain.  He has been having some nausea since he started his oral iron, but this is not a chronic problem for him.  He was having significant abdominal swelling secondary to the ascites which is improved with diuretics.  He continues to have episodic swelling which responds to diuretics.  He is watching his salt intake, but admits he does still enjoy occasional salty snacks like salted popcorn.  He denies any known cardiopulmonary comorbidities.  He does have a smoker's cough, but denies chest pain/pressure, orthopnea, lightheadedness/dizziness, palpitations or dyspnea.  He is currently taking Lasix 40 mg twice a day and Aldactone 50 mg twice a day.  No history of hepatic encephalopathy or SBP.   Past Medical History:  Diagnosis Date   ADHD    Anxiety and depression    Blood in stool    hx of   Cirrhosis (HCC)    Headache    Hematuria    hx of   Hernia of abdominal cavity    Hyperlipidemia    Neoplasm of uncertain behavior of liver    Neoplasm of uncertain behavior of liver    Pancytopenia (HCC)    Portal hypertension (HCC)    Portal vein thrombosis    Shortness of breath on exertion    Symptom of blood in vomit    hx of   Type 2 diabetes mellitus (HCC)  Past Surgical History:  Procedure Laterality Date   CIRCUMCISION     dental surgeries     TONSILLECTOMY     URETHRAL STRICTURE DILATATION     Family History  Problem Relation Age of Onset   Hypertension Mother    Skin cancer Father    Emphysema Father    Bipolar disorder Father    Diabetes Sister    Hypertension Sister    Autism spectrum disorder Daughter    Social History   Tobacco Use   Smoking status: Every Day    Packs/day: 1.50    Years: 38.00    Pack years: 57.00    Types: Cigarettes   Smokeless  tobacco: Never  Vaping Use   Vaping Use: Never used  Substance Use Topics   Alcohol use: Never   Drug use: Never   Current Outpatient Medications  Medication Sig Dispense Refill   acetaminophen (TYLENOL) 325 MG tablet Take 650 mg by mouth every 6 (six) hours as needed.     ferrous sulfate 325 (65 FE) MG tablet Take by mouth.     furosemide (LASIX) 20 MG tablet Take by mouth.     spironolactone (ALDACTONE) 50 MG tablet Take by mouth.     traMADol (ULTRAM) 50 MG tablet Take by mouth every 6 (six) hours as needed. (Patient not taking: Reported on 07/13/2021)     No current facility-administered medications for this visit.   Allergies  Allergen Reactions   Amoxicillin-Pot Clavulanate Shortness Of Breath   Shellfish Allergy Shortness Of Breath     Review of Systems: All systems reviewed and negative except where noted in HPI.    No results found.  Physical Exam: BP 110/60 (BP Location: Left Arm, Patient Position: Sitting, Cuff Size: Normal)    Pulse 100    Ht $R'5\' 6"'hk$  (1.676 m)    Wt 169 lb (76.7 kg)    BMI 27.28 kg/m  Constitutional: Pleasant,well-developed, Caucasian male in no acute distress.  Accompanied by girlfriend HEENT: Normocephalic and atraumatic. Conjunctivae are normal. No scleral icterus.  Edentulous Neck supple.  Cardiovascular: Normal rate, regular rhythm.  Pulmonary/chest: Effort normal and breath sounds normal. No wheezing, rales or rhonchi. Abdominal: Soft, nondistended, nontender. Bowel sounds active throughout. There are no masses palpable. No hepatomegaly. Extremities: no edema Neurological: Alert and oriented to person place and time.  No asterixis Skin: Skin is warm and dry. No rashes noted. Psychiatric: Normal mood and affect. Behavior is normal.  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  He is primarily managed by [facility].  His transplant hepatology team requested an EGD to screen and treat  for varices prior to initiation of anticoagulation for portal vein thrombus.  Currently, we perform a banding procedures in the hospital setting due to the elevated risk.  And our hospital availability is extremely limited.  We are able to perform a diagnostic EGD at endoscopy center on a fairly expedited basis.  I recommended we perform this procedure to assess his varices, and then will discuss with the hepatology team about risks and benefits of anticoagulation based on those findings.  He also needs a colonoscopy, but this is less urgent.  We will schedule him for a screening colonoscopy after his EGD is complete.  NASH cirrhosis with ascites - EGD to assess varices - Will contact [facility] to discuss logistical barriers to banding to eradication and reassess urgency/necessity of anticoagulation and discuss initiation of beta blockers in lieu of banding - Defer management of diuretics to [facility], patient appears euvolemic on exam today  Suspicious hepatic lesion - Repeat MRI today, patient to follow up with [facility] next week  Anemia, iron deficiency - Suspect patient will have PHG or GAVE on EGD - Continue oral iron  Colon cancer screening - Schedule colonoscopy at  later date  The details, risks (including bleeding, perforation, infection, missed lesions, medication reactions and possible hospitalization or surgery if complications occur), benefits, and alternatives to EGD with possible biopsy and possible dilation were discussed with the patient and he consents to proceed.   Dhrithi Riche E. Candis Schatz, MD [facility]   CC:  [address], [place], [place]

## 2021-07-26 NOTE — Progress Notes (Incomplete)
[place]  [address] [place],  [place]  [address] [phone]  Clinic Day:  07/26/2021  Referring physician: Venetia Maxon, Sharon Mt, *  This document serves as a record of services personally performed by Marice Potter, MD. It was created on their behalf by Curry,Lauren E, a trained medical scribe. The creation of this record is based on the scribe's personal observations and the provider's statements to them.  REASON FOR CONSULTATION:  Pancytopenia  HISTORY OF PRESENT ILLNESS:  Colton Leon is a 56 y.o. male with a history of pancytopenia who is referred in consultation by Christa See for assessment and management. Colton Leon originally presented in November 2022 for a work related abdominal injury. He was evaluated by ultrasound which revealed recanalization of the umbilical vein with nonocclusive thrombus of the portal splenic confluence, splenomegaly and trace ascites. CT of the abdomen revealed no findings related to injury, but reported a cirrhotic appearance to the liver with evidence of portal hypertension including splenomegaly and paraesophageal varices, concerning for nonocclusive portal vein thrombus, small amount of abdominal ascites, and subcentimeter lesion within the right hepatic lobe of the liver.   He then underwent MRI of the abdomen with and without contrast on 05/20/2021 and was noted to have a 2.7 cm lesion within segment 4 of the liver with early arterial enhancement and late phase suggestion of washout and pseudocapsule although it is noted that the imaging was of poor quality, the lesion was highly concerning for hepatocellular carcinoma. Recommendation was for repeat MRI imaging. Unfortunately, he lost insurance around this time and was unable to pursue further imaging.   He established insurance and was evaluated by [facility] in [place]. Serologic evaluation was negative for viral,  autoimmune, or genetic liver disease. He has no immunity to hepatitis A or B. He was also noted to have pancytopenia with WBC 1.8 and Hgb 7.3. He was noted to have iron deficiency anemia with ferritin of 5 and saturation of 10%. He was started on oral iron at that time. AFP in January 2023 was 3.3, indirect bilirubin 1.6, INR of 1.2 with alkaline phosphatase/ AST/ ALT within normal limits. In response to his ascites, he was started on furosemide 40 mg daily and spironolactone 100 mg daily.   Today he presents with his girlfriend for continued evaluation. He denies fever, chills, nausea or vomiting. He denies abdominal pain or increase in ascites. He denies issue with bowel or bladder. Medical history is significant for blood in stool, ulcer, type 2 diabetes, hyperlipidemia, other cirrhosis of liver, ascites, portal hypertension and portal vein thrombosis. Past surgical history consists of a tonsillectomy. Family history is significant for hypertension, skin cancer, bipolar disorder, emphysema, diabetes and mental health disorder. He is currently unemployed having just lost his job. He worked as a Administrator. He smokes daily 1-1/2 packs per day. He denies alcohol or substance use. He is divorced with 2 children and lives with his girlfriend.   REVIEW OF SYSTEMS:  Review of Systems  Constitutional:  Negative for appetite change, chills, diaphoresis, fatigue, fever and unexpected weight change.  HENT:   Negative for hearing loss, lump/mass, mouth sores, nosebleeds, sore throat, tinnitus, trouble swallowing and voice change.   Eyes:  Negative for eye problems and icterus.  Respiratory:  Negative for chest tightness, cough, hemoptysis, shortness of breath and wheezing.   Cardiovascular:  Negative for chest pain, leg swelling and palpitations.  Gastrointestinal:  Positive for abdominal distention.  Negative for abdominal pain, blood in stool, constipation, diarrhea, nausea, rectal pain and vomiting.   Endocrine: Negative for hot flashes.  Genitourinary:  Negative for bladder incontinence, difficulty urinating, dyspareunia, dysuria, frequency, hematuria and nocturia.   Musculoskeletal:  Negative for arthralgias, back pain, flank pain, gait problem, myalgias, neck pain and neck stiffness.  Skin:  Negative for itching, rash and wound.  Neurological:  Negative for dizziness, extremity weakness, gait problem, headaches, light-headedness, numbness, seizures and speech difficulty.  Hematological:  Negative for adenopathy. Does not bruise/bleed easily.  Psychiatric/Behavioral:  Negative for confusion, decreased concentration, depression, sleep disturbance and suicidal ideas. The patient is not nervous/anxious.     VITALS:  There were no vitals taken for this visit.  Wt Readings from Last 3 Encounters:  07/26/21 169 lb (76.7 kg)  07/13/21 169 lb 3.2 oz (76.7 kg)    There is no height or weight on file to calculate BMI.  Performance status (ECOG): 1 - Symptomatic but completely ambulatory  PHYSICAL EXAM:  Physical Exam Constitutional:      General: He is not in acute distress.    Appearance: Normal appearance. He is normal weight. He is not ill-appearing, toxic-appearing or diaphoretic.  HENT:     Head: Normocephalic and atraumatic.     Right Ear: Tympanic membrane normal.     Left Ear: Tympanic membrane normal.     Nose: Nose normal. No congestion or rhinorrhea.     Mouth/Throat:     Mouth: Mucous membranes are moist.     Pharynx: Oropharynx is clear. No oropharyngeal exudate or posterior oropharyngeal erythema.  Eyes:     General: No scleral icterus.       Right eye: No discharge.        Left eye: No discharge.     Extraocular Movements: Extraocular movements intact.     Conjunctiva/sclera: Conjunctivae normal.     Pupils: Pupils are equal, round, and reactive to light.  Neck:     Vascular: No carotid bruit.  Cardiovascular:     Rate and Rhythm: Normal rate and regular rhythm.      Heart sounds: No murmur heard.   No friction rub. No gallop.  Pulmonary:     Effort: Pulmonary effort is normal. No respiratory distress.     Breath sounds: Normal breath sounds. No stridor. No wheezing, rhonchi or rales.  Chest:     Chest wall: No tenderness.  Abdominal:     General: Abdomen is flat. Bowel sounds are normal. There is distension.     Palpations: There is no mass.     Tenderness: There is no abdominal tenderness. There is no right CVA tenderness, left CVA tenderness, guarding or rebound.     Hernia: No hernia is present.  Musculoskeletal:        General: No swelling, tenderness, deformity or signs of injury. Normal range of motion.     Cervical back: Normal range of motion and neck supple. No rigidity or tenderness.     Right lower leg: No edema.     Left lower leg: No edema.  Lymphadenopathy:     Cervical: No cervical adenopathy.  Skin:    General: Skin is warm and dry.     Capillary Refill: Capillary refill takes less than 2 seconds.     Coloration: Skin is not jaundiced or pale.     Findings: No bruising, erythema, lesion or rash.  Neurological:     General: No focal deficit present.     Mental  Status: He is alert and oriented to person, place, and time. Mental status is at baseline.     Cranial Nerves: No cranial nerve deficit.     Sensory: No sensory deficit.     Motor: No weakness.     Coordination: Coordination normal.     Gait: Gait normal.     Deep Tendon Reflexes: Reflexes normal.  Psychiatric:        Mood and Affect: Mood normal.        Behavior: Behavior normal.        Thought Content: Thought content normal.        Judgment: Judgment normal.     LABS:   CBC Latest Ref Rng & Units 07/13/2021  WBC - 1.6  Hemoglobin 13.5 - 17.5 9.1(A)  Hematocrit 41 - 53 29(A)  Platelets 150 - 399 53(A)   CMP Latest Ref Rng & Units 07/13/2021  BUN 4 - 21 16  Creatinine 0.6 - 1.3 0.6  Sodium 137 - 147 137  Potassium 3.4 - 5.3 4.3  Chloride 99 - 108 101   CO2 13 - 22 28(A)  Calcium 8.7 - 10.7 8.7  Alkaline Phos 25 - 125 80  AST 14 - 40 37  ALT 10 - 40 22     No results found for: CEA1 / No results found for: CEA1 No results found for: PSA1 No results found for: MGN003 No results found for: CAN125  No results found for: TOTALPROTELP, ALBUMINELP, A1GS, A2GS, BETS, BETA2SER, GAMS, MSPIKE, SPEI Lab Results  Component Value Date   TIBC 495 (H) 07/13/2021   FERRITIN 7 (L) 07/13/2021   IRONPCTSAT 47 (H) 07/13/2021   No results found for: LDH  STUDIES:  No results found.    HISTORY:   Past Medical History:  Diagnosis Date   ADHD    Anxiety and depression    Blood in stool    hx of   Cirrhosis ([place])    Depression    Headache    Hematuria    hx of   Hernia of abdominal cavity    Hyperlipidemia    Kidney stones    Neoplasm of uncertain behavior of liver    Neoplasm of uncertain behavior of liver    Pancytopenia (HCC)    Peptic ulcer    Pneumonia    Portal hypertension (HCC)    Portal vein thrombosis    Shortness of breath on exertion    Symptom of blood in vomit    hx of   Type 2 diabetes mellitus ([place])       Family History  Problem Relation Age of Onset   Hypertension Mother    Skin cancer Mother    Skin cancer Father    Emphysema Father    Bipolar disorder Father    Heart disease Father    Diabetes Sister    Hypertension Sister    Heart disease Maternal Grandmother    Skin cancer Maternal Grandfather    Heart disease Paternal Grandmother    Skin cancer Paternal Grandfather    Heart disease Paternal Grandfather    Autism spectrum disorder Daughter    Diabetes Nephew     Social History:  reports that he has been smoking cigarettes. He has a 57.00 pack-year smoking history. He has never used smokeless tobacco. He reports that he does not drink alcohol and does not use drugs.The patient is accompanied by girlfriend today.  Allergies:  Allergies  Allergen Reactions   Augmentin [Amoxicillin-Pot  Clavulanate] Shortness Of Breath   Shellfish Allergy Shortness Of Breath    Current Medications: Current Outpatient Medications  Medication Sig Dispense Refill   acetaminophen (TYLENOL) 325 MG tablet Take 650 mg by mouth every 6 (six) hours as needed.     ferrous sulfate 325 (65 FE) MG tablet Take by mouth.     furosemide (LASIX) 20 MG tablet Take by mouth.     spironolactone (ALDACTONE) 50 MG tablet Take by mouth.     traMADol (ULTRAM) 50 MG tablet Take by mouth every 6 (six) hours as needed.     No current facility-administered medications for this visit.     ASSESSMENT & PLAN:   Assessment:  Colton Leon is a 56 y.o. male with history of pancytopenia and liver lesion. Today, his hemoglobin has improved from 7.3 to 9.1 on oral iron. He remains with microcytic anemia today. White count remains 1.8 and platelets are 53 today. He was recommended for repeat MRI of the abdomen for better evaluation of liver lesion and this is scheduled on 03/07 as well as an EGD. We discussed the many causes of his low blood counts including anemia, liver disease or bone marrow dysfunction. If MRI of the abdomen indicates a concern for malignancy, the next step would be biopsy. We also discussed that at some point his bone marrow may require evaluation. Both the patient and his girlfriend understand that, unfortunately we still have a lot of unanswered questions. They are anxious to continue with diagnostics and move toward a treatment plan.   Plan: 1.  We will wait for results of upcoming imaging and he will return to clinic in 3 weeks for discussion of next steps in treatment planning.   I discussed the assessment and treatment plan with the patient.  The patient was provided an opportunity to ask questions and all were answered.  The patient agreed with the plan and demonstrated an understanding of the instructions.  The patient was advised to call back if the symptoms worsen or if the condition fails  to improve as anticipated.   I, Rita Ohara, am acting as scribe for Marice Potter, MD    I have reviewed this report as typed by the medical scribe, and it is complete and accurate.  Dequincy Macarthur Critchley, MD

## 2021-07-29 ENCOUNTER — Ambulatory Visit: Payer: Managed Care, Other (non HMO) | Admitting: Oncology

## 2021-07-29 ENCOUNTER — Encounter: Payer: Self-pay | Admitting: Gastroenterology

## 2021-07-29 ENCOUNTER — Ambulatory Visit (AMBULATORY_SURGERY_CENTER): Payer: Managed Care, Other (non HMO) | Admitting: Gastroenterology

## 2021-07-29 VITALS — BP 110/68 | HR 84 | Temp 98.9°F | Resp 18 | Ht 66.0 in | Wt 169.0 lb

## 2021-07-29 DIAGNOSIS — K7460 Unspecified cirrhosis of liver: Secondary | ICD-10-CM | POA: Diagnosis not present

## 2021-07-29 DIAGNOSIS — K2960 Other gastritis without bleeding: Secondary | ICD-10-CM

## 2021-07-29 DIAGNOSIS — I8500 Esophageal varices without bleeding: Secondary | ICD-10-CM

## 2021-07-29 DIAGNOSIS — K2950 Unspecified chronic gastritis without bleeding: Secondary | ICD-10-CM | POA: Diagnosis not present

## 2021-07-29 MED ORDER — SODIUM CHLORIDE 0.9 % IV SOLN: 500.0000 mL | INTRAVENOUS | Status: DC

## 2021-07-29 NOTE — Progress Notes (Signed)
Pt's states no medical or surgical changes since previsit or office visit. 

## 2021-07-29 NOTE — Progress Notes (Signed)
History and Physical Interval Note: ? ?07/29/2021 ?3:55 PM ? ?Colton Leon  has presented today for endoscopic procedure(s), with the diagnosis of  ?Encounter Diagnosis  ?Name Primary?  ? Cirrhosis of liver without ascites, unspecified hepatic cirrhosis type ([place]) Yes  ?Marland Kitchen  The various methods of evaluation and treatment have been discussed with the patient and/or family. After consideration of risks, benefits and other options for treatment, the patient has consented to  the endoscopic procedure(s). ? ? The patient's history has been reviewed, patient examined, no change in status, stable for endoscopic procedure(s).  I have reviewed the patient's chart and labs.  Questions were answered to the patient's satisfaction.   ? ? ?Colton Leon E. Candis Schatz, MD ?[facility] ? ?

## 2021-07-29 NOTE — Progress Notes (Signed)
Called to room to assist during endoscopic procedure.  Patient ID and intended procedure confirmed with present staff. Received instructions for my participation in the procedure from the performing physician.  

## 2021-07-29 NOTE — Op Note (Signed)
[name] ?Patient Name: Colton Leon ?Procedure Date: 07/29/2021 3:48 PM ?MRN: [id number] ?Endoscopist: Keyaan Lederman E. Candis Schatz , MD ?Age: 56 ?Referring MD:  ?Date of Birth: 1966/03/24 ?Gender: Male ?Account #: [id number] ?Procedure:                Upper GI endoscopy ?Indications:              Cirrhosis with suspected esophageal varices ?Medicines:                Monitored Anesthesia Care ?Procedure:                Pre-Anesthesia Assessment: ?                          - Prior to the procedure, a History and Physical  ?                          was performed, and patient medications and  ?                          allergies were reviewed. The patient's tolerance of  ?                          previous anesthesia was also reviewed. The risks  ?                          and benefits of the procedure and the sedation  ?                          options and risks were discussed with the patient.  ?                          All questions were answered, and informed consent  ?                          was obtained. Prior Anticoagulants: The patient has  ?                          taken no previous anticoagulant or antiplatelet  ?                          agents. ASA Grade Assessment: III - A patient with  ?                          severe systemic disease. After reviewing the risks  ?                          and benefits, the patient was deemed in  ?                          satisfactory condition to undergo the procedure. ?                          After obtaining informed consent, the endoscope was  ?  passed under direct vision. Throughout the  ?                          procedure, the patient's blood pressure, pulse, and  ?                          oxygen saturations were monitored continuously. The  ?                          GIF [id number] #[id number] was introduced through the  ?                          mouth, and advanced to the second part of duodenum.  ?                          The  upper GI endoscopy was accomplished without  ?                          difficulty. The patient tolerated the procedure  ?                          well. ?Scope In: ?Scope Out: ?Findings:                 The examined portions of the nasopharynx,  ?                          oropharynx and larynx were normal. ?                          Grade III, large (> 5 mm) varices were found in the  ?                          lower third of the esophagus. ?                          The exam of the esophagus was otherwise normal. ?                          Type 1 gastroesophageal varices (GOV1, esophageal  ?                          varices which extend along the lesser curvature)  ?                          with no bleeding were found in the cardia. There  ?                          were no stigmata of recent bleeding. ?                          A deformity was found in the prepyloric region of  ?                          the stomach  suggestive of a previous history of  ?                          peptic ulcer. ?                          Localized mildly erythematous mucosa with polypoid  ?                          changes without bleeding was found in the gastric  ?                          antrum. Biopsies were taken with a cold forceps for  ?                          Helicobacter pylori testing. Estimated blood loss  ?                          was minimal. ?                          Diffuse granular mucosa was found in the entire  ?                          examined stomach. ?                          The examined duodenum was normal. ?Complications:            No immediate complications. ?Estimated Blood Loss:     Estimated blood loss was minimal. ?Impression:               - The examined portions of the nasopharynx,  ?                          oropharynx and larynx were normal. ?                          - Grade III, large (> 5 mm) esophageal varices  ?                          extending from the GEJ to 25 cm from the  incisors. ?                          - Type 1 gastroesophageal varices (GOV1, esophageal  ?                          varices which extend along the lesser curvature),  ?                          without bleeding. ?                          - Acquired deformity in the prepyloric region of  ?  the stomach. ?                          - Erythematous mucosa in the antrum. Biopsied. ?                          - Granular gastric mucosa. ?                          - Normal examined duodenum. ?Recommendation:           - Patient has a contact number available for  ?                          emergencies. The signs and symptoms of potential  ?                          delayed complications were discussed with the  ?                          patient. Return to normal activities tomorrow.  ?                          Written discharge instructions were provided to the  ?                          patient. ?                          - Resume previous diet. ?                          - Continue present medications. ?                          - Await pathology results. ?                          - Schedule patient for repeat EGD with banding in  ?                          the hospital ?[place]. Candis Schatz, MD ?07/29/2021 4:40:29 PM ?This report has been signed electronically. ?

## 2021-07-29 NOTE — Progress Notes (Signed)
Sedate, gd SR, tolerated procedure well, VSS, report to RN 

## 2021-07-29 NOTE — Patient Instructions (Signed)
Discharge instructions given. ?Office will schedule repeat EGD with banding in the hospital. ?Resume previous medications. ?YOU HAD AN ENDOSCOPIC PROCEDURE TODAY AT [facility]:   Refer to the procedure report that was given to you for any specific questions about what was found during the examination.  If the procedure report does not answer your questions, please call your gastroenterologist to clarify.  If you requested that your care partner not be given the details of your procedure findings, then the procedure report has been included in a sealed envelope for you to review at your convenience later. ? ?YOU SHOULD EXPECT: Some feelings of bloating in the abdomen. Passage of more gas than usual.  Walking can help get rid of the air that was put into your GI tract during the procedure and reduce the bloating. If you had a lower endoscopy (such as a colonoscopy or flexible sigmoidoscopy) you may notice spotting of blood in your stool or on the toilet paper. If you underwent a bowel prep for your procedure, you may not have a normal bowel movement for a few days. ? ?Please Note:  You might notice some irritation and congestion in your nose or some drainage.  This is from the oxygen used during your procedure.  There is no need for concern and it should clear up in a day or so. ? ?SYMPTOMS TO REPORT IMMEDIATELY: ? ?Following upper endoscopy (EGD) ? Vomiting of blood or coffee ground material ? New chest pain or pain under the shoulder blades ? Painful or persistently difficult swallowing ? New shortness of breath ? Fever of 100?F or higher ? Black, tarry-looking stools ? ?For urgent or emergent issues, a gastroenterologist can be reached at any hour by calling [phone]. ?Do not use MyChart messaging for urgent concerns.  ? ? ?DIET:  We do recommend a small meal at first, but then you may proceed to your regular diet.  Drink plenty of fluids but you should avoid alcoholic beverages for 24  hours. ? ?ACTIVITY:  You should plan to take it easy for the rest of today and you should NOT DRIVE or use heavy machinery until tomorrow (because of the sedation medicines used during the test).   ? ?FOLLOW UP: ?Our staff will call the number listed on your records 48-72 hours following your procedure to check on you and address any questions or concerns that you may have regarding the information given to you following your procedure. If we do not reach you, we will leave a message.  We will attempt to reach you two times.  During this call, we will ask if you have developed any symptoms of COVID 19. If you develop any symptoms (ie: fever, flu-like symptoms, shortness of breath, cough etc.) before then, please call [phone].  If you test positive for Covid 19 in the 2 weeks post procedure, please call and report this information to [place].   ? ?If any biopsies were taken you will be contacted by phone or by letter within the next 1-3 weeks.  Please call us at [phone] if you have not heard about the biopsies in 3 weeks.  ? ? ?SIGNATURES/CONFIDENTIALITY: ?You and/or your care partner have signed paperwork which will be entered into your electronic medical record.  These signatures attest to the fact that that the information above on your After Visit Summary has been reviewed and is understood.  Full responsibility of the confidentiality of this discharge information lies with you and/or your  care-partner.  ?

## 2021-08-01 ENCOUNTER — Ambulatory Visit: Payer: Managed Care, Other (non HMO) | Admitting: Oncology

## 2021-08-01 ENCOUNTER — Ambulatory Visit
Admission: RE | Admit: 2021-08-01 | Discharge: 2021-08-01 | Disposition: A | Discharge status: Home / Self Care | Payer: Managed Care, Other (non HMO) | Source: Ambulatory Visit | Attending: Nurse Practitioner | Admitting: Nurse Practitioner

## 2021-08-01 ENCOUNTER — Other Ambulatory Visit: Payer: Self-pay

## 2021-08-01 ENCOUNTER — Telehealth: Payer: Self-pay

## 2021-08-01 DIAGNOSIS — D376 Neoplasm of uncertain behavior of liver, gallbladder and bile ducts: Secondary | ICD-10-CM

## 2021-08-01 DIAGNOSIS — I8500 Esophageal varices without bleeding: Secondary | ICD-10-CM

## 2021-08-01 DIAGNOSIS — K7460 Unspecified cirrhosis of liver: Secondary | ICD-10-CM

## 2021-08-01 DIAGNOSIS — D508 Other iron deficiency anemias: Secondary | ICD-10-CM

## 2021-08-01 DIAGNOSIS — K7581 Nonalcoholic steatohepatitis (NASH): Secondary | ICD-10-CM

## 2021-08-01 DIAGNOSIS — R188 Other ascites: Secondary | ICD-10-CM

## 2021-08-01 MED ORDER — GADOXETATE DISODIUM 0.25 MMOL/ML IV SOLN
8.0000 mL | Freq: Once | INTRAVENOUS | Status: AC | PRN
  Administered 2021-08-01: 8 mL via INTRAVENOUS

## 2021-08-01 NOTE — Telephone Encounter (Addendum)
Attempted to call pt to let him know he could have the EGD with banding tomorrow. Left voicemail for pt to call back. Called pt's contact person and spoke with his girlfriend. Pt's girlfriend stated that pt would not be able to do EGD tomorrow because he has an oncology appt and MRI tomorrow. Explained to pt's girlfriend that it can take a while to get a hospital case and let her know that Dr. Candis Schatz had said that pt needs EGD with banding because patient has large esophageal varices that need to be banded to eradication before he can be started on anticoagulation for a portal vein clot. Pt's girlfriend stated that he needs to go to oncology appt because this appt has been canceled several times. Will try pt again later.  ?

## 2021-08-01 NOTE — Telephone Encounter (Signed)
-----   Message from Lavena Bullion, DO sent at 08/01/2021  7:58 AM EDT ----- ?Not sure if this is too short of notice, but we had a cancellation for [facility] for 3/14. Can we still get his patient into that spot w/ me for EGD with banding?  ? ?If not, can start looking at my schedule and possibly open up to others in the group to assist if there are other [facility] openings. Thanks.  ? ?VC ? ?----- Message ----- ?From: [name], MD ?Sent: 07/29/2021   5:07 PM EDT ?To: Algernon Huxley, RN, Lavena Bullion, DO, # ? ?All,  ?This patient has large esophageal varices that need to be banded to eradication before he can be started on anticoagulation for a portal vein clot. ? ?Dr. Bryan Lemma mentioned he had a cancellation in the hospital next week.  If none of his patients are able to fill the slot, he agreed to do his initial banding procedure. ? ?It will likely take numerous banding sessions, and I have a hospital week in early April where I hope to add him on for the 0730 Monday slot. ? ?Thanks for the help.  ?[place] ? ? ?

## 2021-08-01 NOTE — Telephone Encounter (Signed)
Understood. Thank you for trying to schedule. Can search some of our colleagues to see what other availability there is at [facility].  ?

## 2021-08-01 NOTE — Telephone Encounter (Signed)
Spoke with pt and pt confirmed he will not be able to have procedure tomorrow. Pt states he has canceled oncology appt 3 times and would prefer to wait until next available hospital time. Called [facility] and canceled procedure.  ?

## 2021-08-02 ENCOUNTER — Encounter (HOSPITAL_COMMUNITY): Admission: RE | Payer: Self-pay | Source: Home / Self Care

## 2021-08-02 ENCOUNTER — Telehealth: Payer: Self-pay | Admitting: *Deleted

## 2021-08-02 ENCOUNTER — Inpatient Hospital Stay: Payer: Managed Care, Other (non HMO) | Attending: Hematology and Oncology | Admitting: Oncology

## 2021-08-02 ENCOUNTER — Telehealth: Payer: Self-pay | Admitting: Oncology

## 2021-08-02 ENCOUNTER — Other Ambulatory Visit: Payer: Self-pay | Admitting: Oncology

## 2021-08-02 ENCOUNTER — Ambulatory Visit (HOSPITAL_COMMUNITY)
Admission: RE | Admit: 2021-08-02 | Payer: Managed Care, Other (non HMO) | Source: Home / Self Care | Admitting: Gastroenterology

## 2021-08-02 DIAGNOSIS — D508 Other iron deficiency anemias: Secondary | ICD-10-CM

## 2021-08-02 DIAGNOSIS — D61818 Other pancytopenia: Secondary | ICD-10-CM | POA: Diagnosis not present

## 2021-08-02 DIAGNOSIS — K7469 Other cirrhosis of liver: Secondary | ICD-10-CM | POA: Diagnosis not present

## 2021-08-02 DIAGNOSIS — K7460 Unspecified cirrhosis of liver: Secondary | ICD-10-CM | POA: Insufficient documentation

## 2021-08-02 DIAGNOSIS — D509 Iron deficiency anemia, unspecified: Secondary | ICD-10-CM | POA: Insufficient documentation

## 2021-08-02 SURGERY — ESOPHAGOGASTRODUODENOSCOPY (EGD) WITH PROPOFOL
Anesthesia: Monitor Anesthesia Care

## 2021-08-02 NOTE — Telephone Encounter (Signed)
Patient called stated you left him a voicemail regarding procedure post op. He is not sure if it is for the EGD he did or something else because he canceled procedure at the hospital. ?

## 2021-08-02 NOTE — Telephone Encounter (Signed)
?  Follow up Call- ? ?Call back number 07/29/2021  ?Post procedure Call Back phone  # [phone]  ?Permission to leave phone message Yes  ?  ?LMOM ?

## 2021-08-02 NOTE — Telephone Encounter (Signed)
Looks like Dr. Loletha Carrow has availability on 08/16/21. Will check with Dr. Loletha Carrow to see if he can assist. He is currently out of the office this week and will return on 3/20.  ?

## 2021-08-02 NOTE — Telephone Encounter (Signed)
Per 08/02/21 los next appt scheduled and confirmed with patient ?

## 2021-08-02 NOTE — Telephone Encounter (Signed)
Attempted to reach pt again- I LMOM explaining I was making our follow up calls to make sure he is ok after his procedure at the [facility] on 07-29-21.  This is not regarding his EGD at the hospital with Dr. Bryan Lemma today.   ?

## 2021-08-02 NOTE — Telephone Encounter (Signed)
Pt called back again. Nurse spoke to pt to explain that we were calling to check on him after his procedure. Pt very agitated and states that we have been calling him all day yesterday and today about a surgery that he has not had yet. RN explained that this call was in regards to the EGD on 07/29/21 and that we were checking that he was eating and drinking and back to normal activities. Pt states he was fine but was again very agitated and yelling stating he has to go because he was at another doctor's office.  ?

## 2021-08-03 ENCOUNTER — Encounter: Payer: Self-pay | Admitting: Oncology

## 2021-08-03 NOTE — Progress Notes (Signed)
..  Feraheme orders changed to venofer due to insurance plan preference.  Message to scheduling has been sent.  

## 2021-08-05 ENCOUNTER — Telehealth: Payer: Self-pay | Admitting: Oncology

## 2021-08-05 NOTE — Telephone Encounter (Signed)
Contacted pt to notify him that insurance will cover Venofer and not Fereheme. Called to schedule his appts but pt will call us back to do so. He is about to start a new job and does not know his work schedule at this time. ? ?Scheduling Message ?Entered by Ulice Dash M on 08/03/2021 at  9:06 AM ?Priority: Routine ?<No visit type provided>  ?Department: [facility]  ?Provider:   ?Appointment Notes:  ?Please schedule pt for 5 doses of venofer.  The original plan was for 2 doses of feraheme but his insurance will only cover venofer.  Please let pt know.  ? ?

## 2021-08-07 NOTE — Telephone Encounter (Signed)
Thanks for the update, Scott. ?I would still like the labs this week to make sure no surprises. ?If all values similar to their priors, then we will proceed as scheduled. ? ?- HD ?

## 2021-08-07 NOTE — Telephone Encounter (Signed)
Colton Leon, ? ?I reviewed Dr. Dayle Points recent office note and yes, I am available to do this EGD for variceal banding on March 28. ? ?However, while I see the patient's chronic thrombocytopenia with a platelet count of about 50,000, I do not find results of any recent PT/INR in either recent office notes or either: Or [facility] lab results from January or February.  The patient also had significant anemia with a hemoglobin of 6.3 within the last 2 months, based on labs from [place]. ? ?Please have the patient get a CBC and PT/INR ordered under Dr. Dayle Points name this week so he can review and decide if any blood products are needed prior to banding. ? ?Nicki Reaper, ? ?Please see above ? ? ?- HD ?

## 2021-08-08 ENCOUNTER — Encounter: Payer: Self-pay | Admitting: Gastroenterology

## 2021-08-08 ENCOUNTER — Other Ambulatory Visit: Payer: Self-pay

## 2021-08-08 NOTE — Telephone Encounter (Signed)
Patient has been scheduled for an EGD with banding at [facility] with Dr. Loletha Carrow on 08/16/21 at 9:45 am. Pt will need to arrive at 8:15 am with a care partner.  ? ?Ambulatory referral to GI in epic. ? ?Lab orders in epic.  ? ?Lm on vm for patient to return call. ?

## 2021-08-08 NOTE — Addendum Note (Signed)
Addended by: Yevette Edwards on: 08/08/2021 09:38 AM ? ? Modules accepted: Orders ? ?

## 2021-08-09 NOTE — Telephone Encounter (Signed)
Called and spoke with Charlene. I informed her of the appt that we have scheduled for the patient at [facility] on 08/16/21. Randell Patient states that pt just started a new job yesterday and will have to see if he can make that appt. Randell Patient was informed that pt will also need to come by this week for lab work to make sure everything is stable before we proceed with EGD. Randell Patient states that she will call and discuss with the patient and one of them will call us back. Charlene verbalized understanding of all information and had no concerns at the end of the call. ?

## 2021-08-11 ENCOUNTER — Other Ambulatory Visit (INDEPENDENT_AMBULATORY_CARE_PROVIDER_SITE_OTHER): Payer: Managed Care, Other (non HMO)

## 2021-08-11 ENCOUNTER — Encounter (HOSPITAL_COMMUNITY): Payer: Self-pay | Admitting: Gastroenterology

## 2021-08-11 DIAGNOSIS — I8500 Esophageal varices without bleeding: Secondary | ICD-10-CM | POA: Diagnosis not present

## 2021-08-11 DIAGNOSIS — D508 Other iron deficiency anemias: Secondary | ICD-10-CM

## 2021-08-11 DIAGNOSIS — R188 Other ascites: Secondary | ICD-10-CM

## 2021-08-11 DIAGNOSIS — K7460 Unspecified cirrhosis of liver: Secondary | ICD-10-CM | POA: Diagnosis not present

## 2021-08-11 LAB — CBC
HCT: 35.4 % — ABNORMAL LOW (ref 39.0–52.0)
Hemoglobin: 11.7 g/dL — ABNORMAL LOW (ref 13.0–17.0)
MCHC: 32.9 g/dL (ref 30.0–36.0)
MCV: 83.5 fl (ref 78.0–100.0)
Platelets: 51 10*3/uL — ABNORMAL LOW (ref 150.0–400.0)
RBC: 4.24 Mil/uL (ref 4.22–5.81)
RDW: 20.7 % — ABNORMAL HIGH (ref 11.5–15.5)
WBC: 1.5 10*3/uL — CL (ref 4.0–10.5)

## 2021-08-11 LAB — PROTIME-INR
INR: 1.4 ratio — ABNORMAL HIGH (ref 0.8–1.0)
Prothrombin Time: 14.9 s — ABNORMAL HIGH (ref 9.6–13.1)

## 2021-08-11 NOTE — Telephone Encounter (Signed)
Proceed with EGD as scheduled next week. ? ?I do not feel that he needs a platelet transfusion before the procedure. ? ?- HD ?

## 2021-08-11 NOTE — Telephone Encounter (Signed)
Called pt to confirm appt on 08/16/21. Left voicemail for pt to return call.  ?

## 2021-08-15 NOTE — Telephone Encounter (Signed)
Spoke with with pt's girlfriend, Colton Leon, who stated that pt would be at appt tomorrow. Went over instructions with pt's girlfriend and she verbalized understanding.  ?

## 2021-08-16 ENCOUNTER — Ambulatory Visit (HOSPITAL_COMMUNITY): Payer: Commercial Managed Care - HMO | Admitting: Registered Nurse

## 2021-08-16 ENCOUNTER — Ambulatory Visit (HOSPITAL_COMMUNITY)
Admission: RE | Admit: 2021-08-16 | Discharge: 2021-08-16 | Disposition: A | Discharge status: Home / Self Care | Payer: Commercial Managed Care - HMO | Attending: Gastroenterology | Admitting: Gastroenterology

## 2021-08-16 ENCOUNTER — Other Ambulatory Visit: Payer: Self-pay

## 2021-08-16 ENCOUNTER — Ambulatory Visit (HOSPITAL_BASED_OUTPATIENT_CLINIC_OR_DEPARTMENT_OTHER): Payer: Commercial Managed Care - HMO | Admitting: Registered Nurse

## 2021-08-16 ENCOUNTER — Encounter (HOSPITAL_COMMUNITY): Payer: Self-pay | Admitting: Gastroenterology

## 2021-08-16 ENCOUNTER — Encounter (HOSPITAL_COMMUNITY)
Admission: RE | Disposition: A | Discharge status: Home / Self Care | Payer: Self-pay | Source: Home / Self Care | Attending: Gastroenterology

## 2021-08-16 DIAGNOSIS — F17200 Nicotine dependence, unspecified, uncomplicated: Secondary | ICD-10-CM | POA: Insufficient documentation

## 2021-08-16 DIAGNOSIS — Z8711 Personal history of peptic ulcer disease: Secondary | ICD-10-CM | POA: Diagnosis not present

## 2021-08-16 DIAGNOSIS — D696 Thrombocytopenia, unspecified: Secondary | ICD-10-CM | POA: Diagnosis not present

## 2021-08-16 DIAGNOSIS — I8510 Secondary esophageal varices without bleeding: Secondary | ICD-10-CM | POA: Diagnosis not present

## 2021-08-16 DIAGNOSIS — I8500 Esophageal varices without bleeding: Secondary | ICD-10-CM | POA: Insufficient documentation

## 2021-08-16 DIAGNOSIS — K3189 Other diseases of stomach and duodenum: Secondary | ICD-10-CM | POA: Diagnosis not present

## 2021-08-16 DIAGNOSIS — D61818 Other pancytopenia: Secondary | ICD-10-CM | POA: Insufficient documentation

## 2021-08-16 DIAGNOSIS — E119 Type 2 diabetes mellitus without complications: Secondary | ICD-10-CM

## 2021-08-16 DIAGNOSIS — I81 Portal vein thrombosis: Secondary | ICD-10-CM | POA: Diagnosis not present

## 2021-08-16 DIAGNOSIS — K766 Portal hypertension: Secondary | ICD-10-CM

## 2021-08-16 DIAGNOSIS — K7460 Unspecified cirrhosis of liver: Secondary | ICD-10-CM | POA: Diagnosis not present

## 2021-08-16 HISTORY — PX: ESOPHAGEAL BANDING: SHX5518

## 2021-08-16 HISTORY — PX: ESOPHAGOGASTRODUODENOSCOPY (EGD) WITH PROPOFOL: SHX5813

## 2021-08-16 SURGERY — ESOPHAGOGASTRODUODENOSCOPY (EGD) WITH PROPOFOL
Anesthesia: Monitor Anesthesia Care

## 2021-08-16 MED ORDER — PROPOFOL 500 MG/50ML IV EMUL
INTRAVENOUS | Status: DC | PRN
  Administered 2021-08-16: 150 ug/kg/min via INTRAVENOUS

## 2021-08-16 MED ORDER — LIDOCAINE 2% (20 MG/ML) 5 ML SYRINGE
INTRAMUSCULAR | Status: DC | PRN
  Administered 2021-08-16: 100 mg via INTRAVENOUS

## 2021-08-16 MED ORDER — ONDANSETRON HCL 4 MG/2ML IJ SOLN
INTRAMUSCULAR | Status: DC | PRN
  Administered 2021-08-16: 4 mg via INTRAVENOUS

## 2021-08-16 MED ORDER — PROPOFOL 10 MG/ML IV BOLUS
INTRAVENOUS | Status: DC | PRN
  Administered 2021-08-16: 20 mg via INTRAVENOUS
  Administered 2021-08-16: 50 mg via INTRAVENOUS

## 2021-08-16 MED ORDER — LACTATED RINGERS IV SOLN: INTRAVENOUS | Status: DC

## 2021-08-16 MED ORDER — PROPOFOL 1000 MG/100ML IV EMUL
INTRAVENOUS | Status: AC
  Filled 2021-08-16: qty 100

## 2021-08-16 SURGICAL SUPPLY — 15 items

## 2021-08-16 NOTE — Discharge Instructions (Signed)
YOU HAD AN ENDOSCOPIC PROCEDURE TODAY: Refer to the procedure report and other information in the discharge instructions given to you for any specific questions about what was found during the examination. If this information does not answer your questions, please call [place] office at [phone] to clarify.   YOU SHOULD EXPECT: Some feelings of bloating in the abdomen. Passage of more gas than usual. Walking can help get rid of the air that was put into your GI tract during the procedure and reduce the bloating. If you had a lower endoscopy (such as a colonoscopy or flexible sigmoidoscopy) you may notice spotting of blood in your stool or on the toilet paper. Some abdominal soreness may be present for a day or two, also.  DIET: Your first meal following the procedure should be a light meal and then it is ok to progress to your normal diet. A half-sandwich or bowl of soup is an example of a good first meal. Heavy or fried foods are harder to digest and may make you feel nauseous or bloated. Drink plenty of fluids but you should avoid alcoholic beverages for 24 hours. If you had a esophageal dilation, please see attached instructions for diet.    ACTIVITY: Your care partner should take you home directly after the procedure. You should plan to take it easy, moving slowly for the rest of the day. You can resume normal activity the day after the procedure however YOU SHOULD NOT DRIVE, use power tools, machinery or perform tasks that involve climbing or major physical exertion for 24 hours (because of the sedation medicines used during the test).   SYMPTOMS TO REPORT IMMEDIATELY: A gastroenterologist can be reached at any hour. Please call [phone]  for any of the following symptoms:   Following upper endoscopy (EGD, EUS, ERCP, esophageal dilation) Vomiting of blood or coffee ground material  New, significant abdominal pain  New, significant chest pain or pain under the shoulder blades  Painful or  persistently difficult swallowing  New shortness of breath  Black, tarry-looking or red, bloody stools  FOLLOW UP:  If any biopsies were taken you will be contacted by phone or by letter within the next 1-3 weeks. Call [phone]  if you have not heard about the biopsies in 3 weeks.  Please also call with any specific questions about appointments or follow up tests.  

## 2021-08-16 NOTE — Op Note (Signed)
[facility] ?Patient Name: Colton Leon ?Procedure Date: 08/16/2021 ?MRN: [id number] ?Attending MD: Estill Cotta. Loletha Carrow , MD ?Date of Birth: [birth year] ?CSN: [id number] ?Age: 56 ?Admit Type: Outpatient ?Procedure:                Upper GI endoscopy ?Indications:              For therapy of esophageal varices ?                          cirrhosis, PVT, varices, plt 50K ?Providers:                Estill Cotta. Loletha Carrow, MD, Jeanella Cara, RN,  ?                          Despina Pole, Technician, Lajuana Carry, CRNA ?Referring MD:             [facility]; Dr. Dustin Flock ?Medicines:                Monitored Anesthesia Care ?Complications:            No immediate complications. ?Estimated Blood Loss:     Estimated blood loss was minimal. ?Procedure:                Pre-Anesthesia Assessment: ?                          - Prior to the procedure, a History and Physical  ?                          was performed, and patient medications and  ?                          allergies were reviewed. The patient's tolerance of  ?                          previous anesthesia was also reviewed. The risks  ?                          and benefits of the procedure and the sedation  ?                          options and risks were discussed with the patient.  ?                          All questions were answered, and informed consent  ?                          was obtained. Prior Anticoagulants: The patient has  ?                          taken no previous anticoagulant or antiplatelet  ?                          agents. ASA Grade Assessment: III - A patient with  ?  severe systemic disease. After reviewing the risks  ?                          and benefits, the patient was deemed in  ?                          satisfactory condition to undergo the procedure. ?                          After obtaining informed consent, the endoscope was  ?                          passed under direct  vision. Throughout the  ?                          procedure, the patient's blood pressure, pulse, and  ?                          oxygen saturations were monitored continuously. The  ?                          [id number]-[id number] ([id number]) Olympus endoscope was introduced  ?                          through the mouth, and advanced to the second part  ?                          of duodenum. The upper GI endoscopy was  ?                          accomplished without difficulty. The patient  ?                          tolerated the procedure well. ?Scope In: ?Scope Out: ?Findings: ?     Grade III varices were found in the middle third of the esophagus and in  ?     the lower third of the esophagus. They were large in size and extend  ?     just distal to the EGJ along the lesser curvature of the stomach. Four  ?     bands were successfully placed with good placement and significant  ?     decompression. Most likely not complete eradication. Scant self-limited  ?     oozing. ?     Portal hypertensive gastropathy was found in the entire examined stomach. ?     Multiple erosions with no bleeding and no stigmata of recent bleeding  ?     were found in the prepyloric region of the stomach. ?     The examined duodenum was normal. ?Impression:               - Grade III esophageal varices. Banded. ?                          - Portal hypertensive gastropathy. ?                          -  Erosive gastropathy with no bleeding and no  ?                          stigmata of recent bleeding. ?                          - Normal examined duodenum. ?                          - No specimens collected. ?Moderate Sedation: ?     MAC sedation used ?Recommendation:           - Patient has a contact number available for  ?                          emergencies. The signs and symptoms of potential  ?                          delayed complications were discussed with the  ?                          patient. Return to normal activities tomorrow.  ?                           Written discharge instructions were provided to the  ?                          patient. ?                          - Full liquid diet today. ?                          - Continue present medications. ?                          - Results will be communicated to Dr. Candis Schatz to  ?                          determine timing of repeat endoscopic evaluation  ?                          and possible banding. ?Procedure Code(s):        --- Professional --- ?                          [phone], Esophagogastroduodenoscopy, flexible,  ?                          transoral; with band ligation of esophageal/gastric  ?                          varices ?Diagnosis Code(s):        --- Professional --- ?                          I85.00, Esophageal varices without bleeding ?  K76.6, Portal hypertension ?                          K31.89, Other diseases of stomach and duodenum ?CPT copyright 2019 American Medical Association. All rights reserved. ?The codes documented in this report are preliminary and upon coder review may  ?be revised to meet current compliance requirements. ?Ebrahim Deremer L. Loletha Carrow, MD ?08/16/2021 10:24:51 AM ?This report has been signed electronically. ?Number of Addenda: 0 ?

## 2021-08-16 NOTE — Transfer of Care (Signed)
Immediate Anesthesia Transfer of Care Note ? ?Patient: Colton Leon ? ?Procedure(s) Performed: ESOPHAGOGASTRODUODENOSCOPY (EGD) WITH PROPOFOL ?ESOPHAGEAL BANDING ? ?Patient Location: PACU ? ?Anesthesia Type:MAC ? ?Level of Consciousness: sedated ? ?Airway & Oxygen Therapy: Patient Spontanous Breathing and Patient connected to face mask oxygen ? ?Post-op Assessment: Report given to RN and Post -op Vital signs reviewed and stable ? ?Post vital signs: Reviewed and stable ? ?Last Vitals:  ?Vitals Value Taken Time  ?BP    ?Temp    ?Pulse    ?Resp    ?SpO2    ? ? ?Last Pain:  ?Vitals:  ? 08/16/21 0828  ?TempSrc: Temporal  ?PainSc: 0-No pain  ?   ? ?  ? ?Complications: No notable events documented. ?

## 2021-08-16 NOTE — Anesthesia Preprocedure Evaluation (Signed)
Anesthesia Evaluation  ?Patient identified by MRN, date of birth, ID band ?Patient awake ? ? ? ?Reviewed: ?Allergy & Precautions, H&P , NPO status , Patient's Chart, lab work & pertinent test results, reviewed documented beta blocker date and time  ? ?Airway ?Mallampati: I ? ?TM Distance: >3 FB ?Neck ROM: full ? ? ? Dental ?no notable dental hx. ?(+) Edentulous Upper, Edentulous Lower ?  ?Pulmonary ?neg pulmonary ROS, Current Smoker,  ?  ?Pulmonary exam normal ?breath sounds clear to auscultation ? ? ? ? ? ? Cardiovascular ?Exercise Tolerance: Good ?negative cardio ROS ? ? ?Rhythm:regular Rate:Normal ? ? ?  ?Neuro/Psych ? Headaches, PSYCHIATRIC DISORDERS Anxiety Depression   ? GI/Hepatic ?PUD, (+) Cirrhosis  ?  ?  ? ,   ?Endo/Other  ?diabetes, Type 2 ? Renal/GU ?negative Renal ROS  ?negative genitourinary ?  ?Musculoskeletal ? ? Abdominal ?  ?Peds ? Hematology ?negative hematology ROS ?(+)   ?Anesthesia Other Findings ? ? Reproductive/Obstetrics ?negative OB ROS ? ?  ? ? ? ? ? ? ? ? ? ? ? ? ? ?  ?  ? ? ? ? ? ? ? ? ?Anesthesia Physical ?Anesthesia Plan ? ?ASA: 3 ? ?Anesthesia Plan: MAC  ? ?Post-op Pain Management: Minimal or no pain anticipated  ? ?Induction: Intravenous ? ?PONV Risk Score and Plan: 1 and Treatment may vary due to age or medical condition ? ?Airway Management Planned: Natural Airway, Nasal Cannula and Simple Face Mask ? ?Additional Equipment: None ? ?Intra-op Plan:  ? ?Post-operative Plan:  ? ?Informed Consent: I have reviewed the patients History and Physical, chart, labs and discussed the procedure including the risks, benefits and alternatives for the proposed anesthesia with the patient or authorized representative who has indicated his/her understanding and acceptance.  ? ? ? ?Dental Advisory Given ? ?Plan Discussed with: CRNA and Anesthesiologist ? ?Anesthesia Plan Comments:   ? ? ? ? ? ? ?Anesthesia Quick Evaluation ? ?

## 2021-08-16 NOTE — Anesthesia Postprocedure Evaluation (Signed)
Anesthesia Post Note ? ?Patient: Colton Leon ? ?Procedure(s) Performed: ESOPHAGOGASTRODUODENOSCOPY (EGD) WITH PROPOFOL ?ESOPHAGEAL BANDING ? ?  ? ?Patient location during evaluation: PACU ?Anesthesia Type: MAC ?Level of consciousness: awake and alert ?Pain management: pain level controlled ?Vital Signs Assessment: post-procedure vital signs reviewed and stable ?Respiratory status: spontaneous breathing, nonlabored ventilation, respiratory function stable and patient connected to nasal cannula oxygen ?Cardiovascular status: blood pressure returned to baseline and stable ?Postop Assessment: no apparent nausea or vomiting ?Anesthetic complications: no ? ? ?No notable events documented. ? ?Last Vitals:  ?Vitals:  ? 08/16/21 1040 08/16/21 1050  ?BP: (!) 124/96 119/65  ?Pulse: 85 76  ?Resp: 12 17  ?Temp:    ?SpO2: 100% 99%  ?  ?Last Pain:  ?Vitals:  ? 08/16/21 1050  ?TempSrc:   ?PainSc: 0-No pain  ? ? ?  ?  ?  ?  ?  ?  ? ?Earnestine Shipp ? ? ? ? ?

## 2021-08-16 NOTE — H&P (Addendum)
History and Physical: ? This patient presents for endoscopic testing for: ?Esophageal and gastric varices ? ?This is a 56 year old man known to my partner Dr. Dustin Flock from a recent office consult, and he is also a patient of the [facility] hepatology practice. ?I performed an extensive chart review and discussed the case with Dr. Candis Schatz. ?This man has a relatively recent diagnosis of cirrhosis with a nonocclusive portal vein thrombosis as well as grade 3 esophageal varices extending onto the lesser curve of the stomach.  His hepatology practice wants to put him on anticoagulation but requested band ligation for eradication of varices prior to doing so. ? ?[name] feeling well today.  He denies abdominal pain, chest pain or dyspnea. ?He was well aware of our plans for today, however we then discussed at all again in detail along with technical aspects of the procedure and risks and benefits and he was agreeable. ? The benefits and risks of the planned procedure were described in detail with the patient or (when appropriate) their health care proxy.  Risks were outlined as including, but not limited to, bleeding, infection, perforation, adverse medication reaction leading to cardiac or pulmonary decompensation, pancreatitis (if ERCP).  The limitation of incomplete mucosal visualization was also discussed.  No guarantees or warranties were given. ? ?He understands this is a high risk procedure, and if variceal bleeding should ensue during the process of prophylactic banding, it would be an emergency and potentially require intubation, hospital admission and further endoscopic and medical management. ? ?ROS: ?Patient denies chest pain or shortness of breath ? ? ?Past Medical History: ?Past Medical History:  ?Diagnosis Date  ? ADHD   ? Anxiety and depression   ? Blood in stool   ? hx of  ? Cirrhosis (Colton Leon)   ? Depression   ? Headache   ? Hematuria   ? hx of  ? Hernia of abdominal cavity   ? Hyperlipidemia   ?  Kidney stones   ? Neoplasm of uncertain behavior of liver   ? Neoplasm of uncertain behavior of liver   ? Pancytopenia (Colton Leon)   ? Peptic ulcer   ? Pneumonia   ? Portal hypertension (HCC)   ? Portal vein thrombosis   ? Shortness of breath on exertion   ? Symptom of blood in vomit   ? hx of  ? Type 2 diabetes mellitus (Colton Leon)   ? ? ? ?Past Surgical History: ?Past Surgical History:  ?Procedure Laterality Date  ? CIRCUMCISION    ? dental surgeries    ? TONSILLECTOMY    ? URETHRAL STRICTURE DILATATION    ? ? ?Allergies: ?Allergies  ?Allergen Reactions  ? Augmentin [Amoxicillin-Pot Clavulanate] Shortness Of Breath  ? Shellfish Allergy Shortness Of Breath  ? ? ?Outpatient Meds: ?Current Facility-Administered Medications  ?Medication Dose Route Frequency Provider Last Rate Last Admin  ? lactated ringers infusion   Intravenous Continuous Nelida Meuse III, MD 10 mL/hr at 08/16/21 0915 Continued from Pre-op at 08/16/21 0915  ? ? ? ? ?___________________________________________________________________ ?Objective  ? ?Exam: ? ?BP 123/63   Pulse 79   Temp 97.9 ?F (36.6 ?C) (Temporal)   Resp 17   Ht '5\' 6"'$  (1.676 m)   Wt 74.4 kg   SpO2 99%   BMI 26.47 kg/m?  ? ?CV: RRR without murmur, S1/S2 ?Resp: clear to auscultation bilaterally, normal RR and effort noted ?GI: soft, no tenderness, with active bowel sounds. ? ? ? ?  Latest Ref Rng & Units 08/11/2021  ?  7:38 AM 07/13/2021  ? 12:00 AM  ?CBC  ?WBC 4.0 - 10.5 K/uL 1.5 Repeated and verified X2.   1.6    ?Hemoglobin 13.0 - 17.0 g/dL 11.7   9.1    ?Hematocrit 39.0 - 52.0 % 35.4   29    ?Platelets 150.0 - 400.0 K/uL 51.0   53    ? ? ? ?Assessment: ?Esophageal and gastric varices ?Chronic pancytopenia including thrombocytopenia which increases risks of this procedure. ? ?Plan: ? ?EGD with esophageal variceal band ligation. ? ? ?The patient is appropriate for an endoscopic procedure in the ambulatory setting. ? ? - Wilfrid Lund, MD ? ? ? ? ? ?

## 2021-08-16 NOTE — Interval H&P Note (Signed)
History and Physical Interval Note: ? ?08/16/2021 ?9:45 AM ? ?Colton Leon  has presented today for surgery, with the diagnosis of large esophageal varices, Cirrhosis of liver with ascites.  The various methods of treatment have been discussed with the patient and family. After consideration of risks, benefits and other options for treatment, the patient has consented to  Procedure(s): ?ESOPHAGOGASTRODUODENOSCOPY (EGD) WITH PROPOFOL (N/A) ?ESOPHAGEAL BANDING (N/A) as a surgical intervention.  The patient's history has been reviewed, patient examined, no change in status, stable for surgery.  I have reviewed the patient's chart and labs.  Questions were answered to the patient's satisfaction.   ? ? ?Nelida Meuse III ? ? ?

## 2021-09-09 ENCOUNTER — Telehealth: Payer: Self-pay

## 2021-09-09 ENCOUNTER — Other Ambulatory Visit: Payer: Self-pay

## 2021-09-09 DIAGNOSIS — K7460 Unspecified cirrhosis of liver: Secondary | ICD-10-CM

## 2021-09-09 DIAGNOSIS — I8500 Esophageal varices without bleeding: Secondary | ICD-10-CM

## 2021-09-09 NOTE — Telephone Encounter (Signed)
Patient has been scheduled for EGD with banding at [facility] with Dr. Loletha Carrow on 09/19/21 at 7:30 am. Patient will need to arrive at [facility] by 6 am with a care partner.  ? ?Ambulatory referral to GI in epic. ? ?Instructions sent to patient via MyChart and mailed. ? ?Lm on vm for Charlene to return call. ?

## 2021-09-09 NOTE — Telephone Encounter (Signed)
-----   Message from [name], MD sent at 09/09/2021  3:01 PM EDT ----- ?Regarding: RE: [place] long opening ?Thanks so much for helping me out again.  I owe you one (two?)   ? ?----- Message ----- ?From: Doran Stabler, MD ?Sent: 09/09/2021   2:52 PM EDT ?To: Yevette Edwards, RN, [name], MD ?Subject: RE: [place] long opening                       ? ?He can go on my May 1 schedule for an EGD with banding. ? ?HD ?----- Message ----- ?From: [name], MD ?Sent: 09/09/2021   1:31 PM EDT ?To: Doran Stabler, MD, Yevette Edwards, RN ?Subject: RE: [place] long opening                       ? ? ?Thanks Mallie Mussel,  ?I have not seen him since you banded him.  He was referred to [place] by Osie Cheeks for variceal banding.  She is his primary hepatologist.  Looks like she last saw him a week before you banded him ? ? ?----- Message ----- ?From: Doran Stabler, MD ?Sent: 09/09/2021   1:06 PM EDT ?To: Yevette Edwards, RN, [name], MD ?Subject: RE: [place] long opening                       ? ?Nicki Reaper, please provide the patient information to me and my nurse Andre Swander to make the arrangements if the patient is agreeable. ?Have you seen him back in the office since the endoscopy? ? ?HD ?----- Message ----- ?From: [name], MD ?Sent: 09/09/2021  12:48 PM EDT ?To: Doran Stabler, MD ?Subject: RE: [place] long opening                       ? ?Yes that would be wonderful.  Currently do not have a slot for him ?----- Message ----- ?From: Doran Stabler, MD ?Sent: 09/09/2021  12:47 PM EDT ?To: Yetta Flock, MD, # ?Subject: [place] long opening                           ? ?I have at least 1 (possibly 2) openings on my May 1 [place] block due to some recent cancellations. ? ?Do either of you have cases and need to get done? ?Nicki Reaper, what about that guy of years I did banding on recently? ? ?- HD ? ? ? ? ? ? ?

## 2021-09-12 ENCOUNTER — Encounter (HOSPITAL_COMMUNITY): Payer: Self-pay | Admitting: Gastroenterology

## 2021-09-12 NOTE — Telephone Encounter (Signed)
Lm on home vm for patient to return call.  

## 2021-09-12 NOTE — Telephone Encounter (Signed)
Pt returned call. He states that he had a concern because a "vague" vm was left for him to return our call. I told the pt that it is not noted that we have permission to leave a detailed vm on his phone. Pt states that he thought that he updated his DPR, but will get it taken care of. He also states that he needs a 2 week notice before he can schedule procedures due to his job. I told pt that unfortunately we call when we have availability and those are usually last minute cancellations. I explained to pt that the hospital availability is limited, I told him that we can put him on the hospital wait list and try to reach out sooner as appts become available. Pt states that he will check with his job to see if there is anyway he can keep the appt on 09/19/21 at [facility]. Pt will call me back to let me know. ?

## 2021-09-13 NOTE — Telephone Encounter (Signed)
Pt returned call and stated that he was at work. Pt states that he submitted the request to get 5/1 off but he has not heard back. I told pt that if he does not hear back by Friday then we will have to cancel his appt and place him on the wait list. Pt verbalized understanding and had no concerns at the end of the call. ?

## 2021-09-13 NOTE — Telephone Encounter (Signed)
Lm on vm for patient to return call to see if he was able to speak with his employer about his appt on 09/19/21. I asked that pt give me a call back. ?

## 2021-09-15 ENCOUNTER — Encounter: Payer: Self-pay | Admitting: Oncology

## 2021-09-15 ENCOUNTER — Telehealth: Payer: Self-pay | Admitting: Gastroenterology

## 2021-09-15 NOTE — Telephone Encounter (Signed)
See alternate telephone encounter. Pt unable to make 09/19/21 appt. EGD at [facility] has been cancelled. Pt will be added to Dr. Dayle Points hospital wait list.  ?

## 2021-09-15 NOTE — Telephone Encounter (Signed)
Patient called stating he had to reschedule his upcoming 5/1 procedure as his work requires he give them at least a month's notice.  He also said that his girlfriend was called instead of him and he didn't get the message about the procedure until Monday.  He says he keeps his phone with him at all times.  Please call patient when this procedure can be rescheduled bearing in mind he will need to give a month's notice to his employer.  Thank you. ?

## 2021-09-19 ENCOUNTER — Ambulatory Visit (HOSPITAL_COMMUNITY): Admit: 2021-09-19 | Payer: Commercial Managed Care - HMO | Admitting: Gastroenterology

## 2021-09-19 SURGERY — ESOPHAGOGASTRODUODENOSCOPY (EGD) WITH PROPOFOL
Anesthesia: Monitor Anesthesia Care

## 2021-09-23 ENCOUNTER — Encounter: Payer: Self-pay | Admitting: Oncology

## 2021-09-23 ENCOUNTER — Telehealth: Payer: Self-pay

## 2021-09-23 NOTE — Telephone Encounter (Signed)
Called patient to give him Dr.Cunningham's next hospital dates June 8th and July 3rd. Patient stated he had to check with his job and call back. Patient also wanted me to check with his insurance company to see what other information was needed to have his procedure approved. ?

## 2021-09-27 NOTE — Telephone Encounter (Signed)
Patient that he was approved to have procedure done on July 3rd. Patient is requesting a call back to discuss. Please advise.   ?

## 2021-10-03 ENCOUNTER — Other Ambulatory Visit: Payer: Self-pay

## 2021-10-03 DIAGNOSIS — K7581 Nonalcoholic steatohepatitis (NASH): Secondary | ICD-10-CM

## 2021-10-03 DIAGNOSIS — I8500 Esophageal varices without bleeding: Secondary | ICD-10-CM

## 2021-10-03 DIAGNOSIS — R188 Other ascites: Secondary | ICD-10-CM

## 2021-10-03 NOTE — Telephone Encounter (Signed)
Patient scheduled for 11/21/21 @ 7:30am. ?

## 2021-10-18 NOTE — Telephone Encounter (Signed)
Patient called states he is suppose to be scheduled for 11/21/21 however it shows he is scheduled for 11/14/21 please schedule accordingly and call patient once done.

## 2021-10-18 NOTE — Telephone Encounter (Signed)
Left VM for patient stating he is scheduled for 11/21/21 @ 9am

## 2021-11-11 ENCOUNTER — Encounter (HOSPITAL_COMMUNITY): Payer: Self-pay | Admitting: Gastroenterology

## 2021-11-15 ENCOUNTER — Other Ambulatory Visit: Payer: Self-pay

## 2021-11-15 ENCOUNTER — Telehealth: Payer: Self-pay

## 2021-11-15 DIAGNOSIS — I8500 Esophageal varices without bleeding: Secondary | ICD-10-CM

## 2021-11-15 NOTE — Telephone Encounter (Signed)
Called patient to remind him of procedure on 11/21/21 @ 9am, to arrive at 7:30am. Patient stated he was able to see instructions in mychart.

## 2021-11-18 ENCOUNTER — Other Ambulatory Visit: Payer: Self-pay | Admitting: Nurse Practitioner

## 2021-11-18 DIAGNOSIS — D376 Neoplasm of uncertain behavior of liver, gallbladder and bile ducts: Secondary | ICD-10-CM

## 2021-11-21 ENCOUNTER — Ambulatory Visit (HOSPITAL_COMMUNITY)
Admission: RE | Admit: 2021-11-21 | Discharge: 2021-11-21 | Disposition: A | Discharge status: Home / Self Care | Payer: Commercial Managed Care - HMO | Attending: Gastroenterology | Admitting: Gastroenterology

## 2021-11-21 ENCOUNTER — Ambulatory Visit (HOSPITAL_COMMUNITY): Payer: Commercial Managed Care - HMO | Admitting: Certified Registered Nurse Anesthetist

## 2021-11-21 ENCOUNTER — Ambulatory Visit (HOSPITAL_BASED_OUTPATIENT_CLINIC_OR_DEPARTMENT_OTHER): Payer: Commercial Managed Care - HMO | Admitting: Certified Registered Nurse Anesthetist

## 2021-11-21 ENCOUNTER — Other Ambulatory Visit: Payer: Self-pay

## 2021-11-21 ENCOUNTER — Encounter (HOSPITAL_COMMUNITY)
Admission: RE | Disposition: A | Discharge status: Home / Self Care | Payer: Self-pay | Source: Home / Self Care | Attending: Gastroenterology

## 2021-11-21 DIAGNOSIS — K3189 Other diseases of stomach and duodenum: Secondary | ICD-10-CM

## 2021-11-21 DIAGNOSIS — Z8711 Personal history of peptic ulcer disease: Secondary | ICD-10-CM | POA: Diagnosis not present

## 2021-11-21 DIAGNOSIS — I8500 Esophageal varices without bleeding: Secondary | ICD-10-CM | POA: Diagnosis not present

## 2021-11-21 DIAGNOSIS — N289 Disorder of kidney and ureter, unspecified: Secondary | ICD-10-CM | POA: Diagnosis not present

## 2021-11-21 DIAGNOSIS — D759 Disease of blood and blood-forming organs, unspecified: Secondary | ICD-10-CM | POA: Insufficient documentation

## 2021-11-21 DIAGNOSIS — F17210 Nicotine dependence, cigarettes, uncomplicated: Secondary | ICD-10-CM | POA: Diagnosis not present

## 2021-11-21 DIAGNOSIS — K766 Portal hypertension: Secondary | ICD-10-CM | POA: Insufficient documentation

## 2021-11-21 DIAGNOSIS — F32A Depression, unspecified: Secondary | ICD-10-CM | POA: Diagnosis not present

## 2021-11-21 DIAGNOSIS — E119 Type 2 diabetes mellitus without complications: Secondary | ICD-10-CM

## 2021-11-21 DIAGNOSIS — I8510 Secondary esophageal varices without bleeding: Secondary | ICD-10-CM

## 2021-11-21 DIAGNOSIS — D649 Anemia, unspecified: Secondary | ICD-10-CM | POA: Insufficient documentation

## 2021-11-21 DIAGNOSIS — F419 Anxiety disorder, unspecified: Secondary | ICD-10-CM | POA: Diagnosis not present

## 2021-11-21 DIAGNOSIS — K7460 Unspecified cirrhosis of liver: Secondary | ICD-10-CM | POA: Diagnosis not present

## 2021-11-21 DIAGNOSIS — K7581 Nonalcoholic steatohepatitis (NASH): Secondary | ICD-10-CM

## 2021-11-21 HISTORY — PX: ESOPHAGOGASTRODUODENOSCOPY (EGD) WITH PROPOFOL: SHX5813

## 2021-11-21 HISTORY — PX: ESOPHAGEAL BANDING: SHX5518

## 2021-11-21 SURGERY — ESOPHAGOGASTRODUODENOSCOPY (EGD) WITH PROPOFOL
Anesthesia: Monitor Anesthesia Care

## 2021-11-21 MED ORDER — VASOPRESSIN 20 UNIT/ML IV SOLN
INTRAVENOUS | Status: AC
  Filled 2021-11-21: qty 1

## 2021-11-21 MED ORDER — LACTATED RINGERS IV SOLN
INTRAVENOUS | Status: AC | PRN
  Administered 2021-11-21: 1000 mL via INTRAVENOUS

## 2021-11-21 MED ORDER — ONDANSETRON HCL 4 MG/2ML IJ SOLN
INTRAMUSCULAR | Status: DC | PRN
  Administered 2021-11-21: 4 mg via INTRAVENOUS

## 2021-11-21 MED ORDER — SODIUM CHLORIDE 0.9 % IV SOLN: INTRAVENOUS | Status: DC

## 2021-11-21 MED ORDER — PROPOFOL 1000 MG/100ML IV EMUL
INTRAVENOUS | Status: AC
  Filled 2021-11-21: qty 100

## 2021-11-21 MED ORDER — PROPOFOL 500 MG/50ML IV EMUL
INTRAVENOUS | Status: DC | PRN
  Administered 2021-11-21: 125 ug/kg/min via INTRAVENOUS

## 2021-11-21 MED ORDER — LIDOCAINE 2% (20 MG/ML) 5 ML SYRINGE
INTRAMUSCULAR | Status: DC | PRN
  Administered 2021-11-21: 60 mg via INTRAVENOUS

## 2021-11-21 MED ORDER — PROPOFOL 10 MG/ML IV BOLUS
INTRAVENOUS | Status: DC | PRN
  Administered 2021-11-21: 50 mg via INTRAVENOUS
  Administered 2021-11-21: 45 mg via INTRAVENOUS
  Administered 2021-11-21: 30 mg via INTRAVENOUS

## 2021-11-21 SURGICAL SUPPLY — 15 items

## 2021-11-21 NOTE — Anesthesia Postprocedure Evaluation (Signed)
Anesthesia Post Note  Patient: Colton Leon  Procedure(s) Performed: ESOPHAGOGASTRODUODENOSCOPY (EGD) WITH PROPOFOL ESOPHAGEAL BANDING     Patient location during evaluation: PACU Anesthesia Type: MAC Level of consciousness: awake and alert Pain management: pain level controlled Vital Signs Assessment: post-procedure vital signs reviewed and stable Respiratory status: spontaneous breathing Cardiovascular status: stable Anesthetic complications: no   No notable events documented.  Last Vitals:  Vitals:   11/21/21 0950 11/21/21 1001  BP: 119/74 130/84  Pulse: 97 83  Resp: (!) 21 16  Temp:    SpO2: 99% 98%    Last Pain:  Vitals:   11/21/21 1001  TempSrc:   PainSc: 0-No pain                 Nolon Nations

## 2021-11-21 NOTE — Op Note (Signed)
[facility] Patient Name: Colton Leon Procedure Date: 11/21/2021 MRN: [id number] Attending MD: Gladstone Pih. Candis Schatz , MD Date of Birth: [birth year] CSN: [id number] Age: 56 Admit Type: Inpatient Procedure:                Upper GI endoscopy Indications:              For therapy of esophageal varices Providers:                Nicki Reaper E. Candis Schatz, MD, Dulcy Fanny, Despina Pole, Technician, Williemae Area, CRNA Referring MD:             Gladstone Pih. Candis Schatz, MD Medicines:                Monitored Anesthesia Care Complications:            No immediate complications. Estimated Blood Loss:     Estimated blood loss was minimal. Procedure:                Pre-Anesthesia Assessment:                           - Prior to the procedure, a History and Physical                            was performed, and patient medications and                            allergies were reviewed. The patient's tolerance of                            previous anesthesia was also reviewed. The risks                            and benefits of the procedure and the sedation                            options and risks were discussed with the patient.                            All questions were answered, and informed consent                            was obtained. Prior Anticoagulants: The patient has                            taken no previous anticoagulant or antiplatelet                            agents. ASA Grade Assessment: III - A patient with                            severe systemic disease. After reviewing the risks  and benefits, the patient was deemed in                            satisfactory condition to undergo the procedure.                           After obtaining informed consent, the endoscope was                            passed under direct vision. Throughout the                            procedure, the patient's blood  pressure, pulse, and                            oxygen saturations were monitored continuously. The                            [id number]-H190 ([id number]) Olympus endoscope was introduced                            through the mouth, and advanced to the second part                            of duodenum. The upper GI endoscopy was                            accomplished without difficulty. The patient                            tolerated the procedure well. Scope In: Scope Out: Findings:      The examined portions of the nasopharynx, oropharynx and larynx were       normal.      Large (> 5 mm) varices were found in the middle third of the esophagus       and in the lower third of the esophagus,extending into the GEJ/gastric       cardia. Proximal extent of varices at 25 cm from the incisors. Five       bands were successfully placed with incomplete eradication of varices.       There was brief oozing following deployment of the fourth band,       otherwise no bleeding. No bleeding at the end of the procedure.      The exam of the esophagus was otherwise normal.      Mild portal hypertensive gastropathy was found in the gastric body.      The exam of the stomach was otherwise normal.      The examined duodenum was normal. Impression:               - The examined portions of the nasopharynx,                            oropharynx and larynx were normal.                           -  Large (> 5 mm) esophageal varices. Incompletely                            eradicated. Banded.                           - Portal hypertensive gastropathy.                           - Normal examined duodenum.                           - No specimens collected. Moderate Sedation:      Not Applicable - Patient had care per Anesthesia. Recommendation:           - Patient has a contact number available for                            emergencies. The signs and symptoms of potential                            delayed  complications were discussed with the                            patient. Return to normal activities tomorrow.                            Written discharge instructions were provided to the                            patient.                           - Clear liquid diet today. Recommend soft diet for                            3-4 days                           - Continue present medications.                           - Repeat upper endoscopy in 4 weeks for retreatment. Procedure Code(s):        --- Professional ---                           [phone], Esophagogastroduodenoscopy, flexible,                            transoral; with band ligation of esophageal/gastric                            varices Diagnosis Code(s):        --- Professional ---                           I85.00, Esophageal varices without bleeding  K76.6, Portal hypertension                           K31.89, Other diseases of stomach and duodenum CPT copyright 2019 American Medical Association. All rights reserved. The codes documented in this report are preliminary and upon coder review may  be revised to meet current compliance requirements. Kaylynn Chamblin E. Candis Schatz, MD 11/21/2021 9:42:00 AM This report has been signed electronically. Number of Addenda: 0

## 2021-11-21 NOTE — Discharge Instructions (Signed)
YOU HAD AN ENDOSCOPIC PROCEDURE TODAY: Refer to the procedure report and other information in the discharge instructions given to you for any specific questions about what was found during the examination. If this information does not answer your questions, please call [place] office at [phone] to clarify.  ° °YOU SHOULD EXPECT: Some feelings of bloating in the abdomen. Passage of more gas than usual. Walking can help get rid of the air that was put into your GI tract during the procedure and reduce the bloating. If you had a lower endoscopy (such as a colonoscopy or flexible sigmoidoscopy) you may notice spotting of blood in your stool or on the toilet paper. Some abdominal soreness may be present for a day or two, also. ° °DIET: Your first meal following the procedure should be a light meal and then it is ok to progress to your normal diet. A half-sandwich or bowl of soup is an example of a good first meal. Heavy or fried foods are harder to digest and may make you feel nauseous or bloated. Drink plenty of fluids but you should avoid alcoholic beverages for 24 hours. If you had a esophageal dilation, please see attached instructions for diet.   ° °ACTIVITY: Your care partner should take you home directly after the procedure. You should plan to take it easy, moving slowly for the rest of the day. You can resume normal activity the day after the procedure however YOU SHOULD NOT DRIVE, use power tools, machinery or perform tasks that involve climbing or major physical exertion for 24 hours (because of the sedation medicines used during the test).  ° °SYMPTOMS TO REPORT IMMEDIATELY: °A gastroenterologist can be reached at any hour. Please call [phone]  for any of the following symptoms:  °Following lower endoscopy (colonoscopy, flexible sigmoidoscopy) °Excessive amounts of blood in the stool  °Significant tenderness, worsening of abdominal pains  °Swelling of the abdomen that is new, acute  °Fever of 100° or  higher  °Following upper endoscopy (EGD, EUS, ERCP, esophageal dilation) °Vomiting of blood or coffee ground material  °New, significant abdominal pain  °New, significant chest pain or pain under the shoulder blades  °Painful or persistently difficult swallowing  °New shortness of breath  °Black, tarry-looking or red, bloody stools ° °FOLLOW UP:  °If any biopsies were taken you will be contacted by phone or by letter within the next 1-3 weeks. Call [phone]  if you have not heard about the biopsies in 3 weeks.  °Please also call with any specific questions about appointments or follow up tests. ° °

## 2021-11-21 NOTE — Transfer of Care (Signed)
Immediate Anesthesia Transfer of Care Note  Patient: Colton Leon  Procedure(s) Performed: ESOPHAGOGASTRODUODENOSCOPY (EGD) WITH PROPOFOL ESOPHAGEAL BANDING  Patient Location: PACU and Endoscopy Unit  Anesthesia Type:MAC  Level of Consciousness: awake, alert  and oriented  Airway & Oxygen Therapy: Patient Spontanous Breathing and Patient connected to face mask oxygen  Post-op Assessment: Report given to RN and Post -op Vital signs reviewed and stable  Post vital signs: Reviewed and stable  Last Vitals:  Vitals Value Taken Time  BP    Temp    Pulse 92 11/21/21 0939  Resp 24 11/21/21 0939  SpO2 100 % 11/21/21 0939  Vitals shown include unvalidated device data.  Last Pain:  Vitals:   11/21/21 0745  TempSrc: Temporal  PainSc: 2       Patients Stated Pain Goal: 2 (51/89/84 2103)  Complications: No notable events documented.

## 2021-11-21 NOTE — Anesthesia Procedure Notes (Signed)
Procedure Name: MAC Date/Time: 11/21/2021 9:16 AM  Performed by: Maxwell Caul, CRNAPre-anesthesia Checklist: Patient identified, Emergency Drugs available, Suction available and Patient being monitored Patient Re-evaluated:Patient Re-evaluated prior to induction Oxygen Delivery Method: Simple face mask Preoxygenation: Pre-oxygenation with 100% oxygen Induction Type: IV induction Airway Equipment and Method: Bite block Placement Confirmation: positive ETCO2 and breath sounds checked- equal and bilateral Dental Injury: Teeth and Oropharynx as per pre-operative assessment

## 2021-11-21 NOTE — H&P (Signed)
[facility] History and Physical   Primary Care Physician:  Street, Sharon Mt, MD   Reason for Procedure:   Therapy of esophageal varices  Plan:    EGD with band ligation of esophageal varices     HPI: Colton Leon is a 56 y.o. male undergoing repeat EGD with banding of esophageal varices.  He previously underwent banding on March 28th and 4 bands were placed.  He tolerated the procedure well.  Past Medical History:  Diagnosis Date   ADHD    Anxiety and depression    Blood in stool    hx of   Cirrhosis ([place])    Depression    Headache    Hematuria    hx of   Hernia of abdominal cavity    Hyperlipidemia    Kidney stones    Neoplasm of uncertain behavior of liver    Neoplasm of uncertain behavior of liver    Pancytopenia (HCC)    Peptic ulcer    Pneumonia    Portal hypertension (HCC)    Portal vein thrombosis    Shortness of breath on exertion    Symptom of blood in vomit    hx of   Type 2 diabetes mellitus ([place])     Past Surgical History:  Procedure Laterality Date   CIRCUMCISION     dental surgeries     ESOPHAGEAL BANDING N/A 08/16/2021   Procedure: ESOPHAGEAL BANDING;  Surgeon: Doran Stabler, MD;  Location: [facility];  Service: Gastroenterology;  Laterality: N/A;   ESOPHAGOGASTRODUODENOSCOPY (EGD) WITH PROPOFOL N/A 08/16/2021   Procedure: ESOPHAGOGASTRODUODENOSCOPY (EGD) WITH PROPOFOL;  Surgeon: Doran Stabler, MD;  Location: [facility];  Service: Gastroenterology;  Laterality: N/A;   TONSILLECTOMY     URETHRAL STRICTURE DILATATION      Prior to Admission medications   Medication Sig Start Date End Date Taking? Authorizing Provider  acetaminophen (TYLENOL) 500 MG tablet Take 500 mg by mouth every 6 (six) hours as needed (pain.).   Yes [provider]  buPROPion [facility] SR) 150 MG 12 hr tablet Take 150 mg by mouth every Monday, Wednesday, and Friday. 10/07/21  Yes [provider]  ferrous sulfate 325  (65 FE) MG tablet Take 325 mg by mouth every evening. 07/01/21 12/28/21 Yes [provider]  furosemide (LASIX) 20 MG tablet Take 40-60 mg by mouth See admin instructions. Take 2 tablets (40 mg) by mouth in the morning & take 3 tablets (60 mg) by mouth in the afternoon. 06/01/21 06/01/22 Yes [provider]  spironolactone (ALDACTONE) 50 MG tablet Take 50-100 mg by mouth See admin instructions. Take 1 tablet (50 mg) by mouth in the morning & take 2 tablets (100 mg) by mouth in the afternoon. 06/01/21 06/01/22 Yes [provider]    Current Facility-Administered Medications  Medication Dose Route Frequency Provider Last Rate Last Admin   0.9 %  sodium chloride infusion   Intravenous Continuous [name], MD       lactated ringers infusion    Continuous PRN [name], MD 20 mL/hr at 11/21/21 0749 1,000 mL at 11/21/21 0749    Allergies as of 10/03/2021 - Review Complete 09/14/2021  Allergen Reaction Noted   Augmentin [amoxicillin-pot clavulanate] Shortness Of Breath 06/01/2021   Shellfish allergy Shortness Of Breath 06/01/2021    Family History  Problem Relation Age of Onset   Hypertension Mother    Skin cancer Mother    Skin cancer Father  Emphysema Father    Bipolar disorder Father    Heart disease Father    Diabetes Sister    Hypertension Sister    Heart disease Maternal Grandmother    Skin cancer Maternal Grandfather    Heart disease Paternal Grandmother    Skin cancer Paternal Grandfather    Heart disease Paternal Grandfather    Autism spectrum disorder Daughter    Diabetes Nephew    Colon cancer Neg Hx     Social History   Socioeconomic History   Marital status: Divorced    Spouse name: Not on file   Number of children: 2   Years of education: 10   Highest education level: GED or equivalent  Occupational History   Occupation: Transport planner  Tobacco Use   Smoking status: Every Day    Packs/day: 1.50    Years: 38.00     Total pack years: 57.00    Types: Cigarettes   Smokeless tobacco: Never  Vaping Use   Vaping Use: Never used  Substance and Sexual Activity   Alcohol use: Never   Drug use: Never   Sexual activity: Not on file  Other Topics Concern   Not on file  Social History Narrative   Not on file   Social Determinants of Health   Financial Resource Strain: Not on file  Food Insecurity: Not on file  Transportation Needs: Not on file  Physical Activity: Not on file  Stress: Not on file  Social Connections: Not on file  Intimate Partner Violence: Not on file    Review of Systems:  All other review of systems negative except as mentioned in the HPI.  Physical Exam: Vital signs BP 119/76   Temp 98 F (36.7 C) (Temporal)   Resp 17   Ht '5\' 6"'$  (1.676 m)   Wt 74.8 kg   SpO2 99%   BMI 26.63 kg/m   General:   Alert,  Well-developed, well-nourished, pleasant and cooperative in NAD Airway:  Mallampati 2 Lungs:  Clear throughout to auscultation.   Heart:  Regular rate and rhythm; no murmurs, clicks, rubs,  or gallops. Abdomen:  Soft, nontender and nondistended. Normal bowel sounds.   Neuro/Psych:  Normal mood and affect. A and O x 3   Colton Roadcap E. Candis Schatz, MD [facility]

## 2021-11-21 NOTE — Anesthesia Preprocedure Evaluation (Signed)
Anesthesia Evaluation  Patient identified by MRN, date of birth, ID band Patient awake    Reviewed: Allergy & Precautions, H&P , NPO status , Patient's Chart, lab work & pertinent test results, reviewed documented beta blocker date and time   Airway Mallampati: I  TM Distance: >3 FB Neck ROM: full    Dental  (+) Edentulous Upper, Edentulous Lower   Pulmonary pneumonia, Current Smoker,    Pulmonary exam normal breath sounds clear to auscultation       Cardiovascular Exercise Tolerance: Good negative cardio ROS   Rhythm:regular Rate:Normal     Neuro/Psych  Headaches, PSYCHIATRIC DISORDERS Anxiety Depression    GI/Hepatic PUD, (+) Cirrhosis       ,   Endo/Other  diabetes, Type 2  Renal/GU Renal disease     Musculoskeletal   Abdominal   Peds  Hematology  (+) Blood dyscrasia, anemia ,   Anesthesia Other Findings   Reproductive/Obstetrics                             Anesthesia Physical  Anesthesia Plan  ASA: 3  Anesthesia Plan: MAC   Post-op Pain Management: Minimal or no pain anticipated   Induction: Intravenous  PONV Risk Score and Plan: 1 and Treatment may vary due to age or medical condition  Airway Management Planned: Natural Airway, Nasal Cannula and Simple Face Mask  Additional Equipment: None  Intra-op Plan:   Post-operative Plan:   Informed Consent: I have reviewed the patients History and Physical, chart, labs and discussed the procedure including the risks, benefits and alternatives for the proposed anesthesia with the patient or authorized representative who has indicated his/her understanding and acceptance.     Dental Advisory Given  Plan Discussed with: CRNA and Anesthesiologist  Anesthesia Plan Comments:         Anesthesia Quick Evaluation

## 2021-11-23 ENCOUNTER — Encounter (HOSPITAL_COMMUNITY): Payer: Self-pay | Admitting: Gastroenterology

## 2021-12-01 NOTE — Progress Notes (Signed)
[facility]  [address] [place],  [place]  [address] [phone]  Clinic Day:  12/02/2021  Referring physician: Emmaline Kluver, *  HISTORY OF PRESENT ILLNESS:  Colton Leon is a 56 y.o. 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  He is currently on diuretics to minimize the amount of ascites from his cirrhosis.  Since last visit also showed him to be more profoundly anemic, which was due to iron deficiency.  Based upon this, he was given a course of IV iron.  He comes in today to reassess his peripheral counts, particularly his hemoglobin, to see how well he responded to his recent IV iron.  Since his last visit, the patient has been doing okay.  He has been told he has a lesion in his liver that may signify the early stages of hepatocellular carcinoma.  He is somewhat frustrated as this issue has not been clarified over time.  This lesion was seen on his December 2022 MRI, but not mentioned on his March 2023 MRI.  VITALS:  Blood pressure 130/69, pulse 81, temperature 98.7 F (37.1 C), resp. rate 18, height '5\' 6"'$  (1.676 m), weight 166 lb 11.2 oz (75.6 kg), SpO2 97 %.  Wt Readings from Last 3 Encounters:  12/02/21 166 lb 11.2 oz (75.6 kg)  11/21/21 165 lb (74.8 kg)  08/16/21 164 lb (74.4 kg)    Body mass index is 26.91 kg/m.  Performance status (ECOG): 1 - Symptomatic but completely ambulatory  PHYSICAL EXAM:  Physical Exam Constitutional:      General: He is not in acute distress.    Appearance: Normal appearance. He is normal weight. He is not ill-appearing, toxic-appearing or diaphoretic.  HENT:     Head: Normocephalic and atraumatic.     Right Ear: Tympanic membrane normal.     Left Ear: Tympanic membrane normal.     Nose: Nose normal. No congestion or rhinorrhea.     Mouth/Throat:     Mouth: Mucous membranes are moist.     Pharynx:  Oropharynx is clear. No oropharyngeal exudate or posterior oropharyngeal erythema.  Eyes:     General: No scleral icterus.       Right eye: No discharge.        Left eye: No discharge.     Extraocular Movements: Extraocular movements intact.     Conjunctiva/sclera: Conjunctivae normal.     Pupils: Pupils are equal, round, and reactive to light.  Neck:     Vascular: No carotid bruit.  Cardiovascular:     Rate and Rhythm: Normal rate and regular rhythm.     Heart sounds: No murmur heard.    No friction rub. No gallop.  Pulmonary:     Effort: Pulmonary effort is normal. No respiratory distress.     Breath sounds: Normal breath sounds. No stridor. No wheezing, rhonchi or rales.  Chest:     Chest wall: No tenderness.  Abdominal:     General: Abdomen is flat. Bowel sounds are normal. There is distension.     Palpations: There is splenomegaly. There is no mass.     Tenderness: There is no abdominal tenderness. There is no right CVA tenderness, left CVA tenderness, guarding or rebound.     Hernia: No hernia is present.  Musculoskeletal:        General: No swelling, tenderness, deformity or signs of injury. Normal range of  motion.     Cervical back: Normal range of motion and neck supple. No rigidity or tenderness.     Right lower leg: No edema.     Left lower leg: No edema.  Lymphadenopathy:     Cervical: No cervical adenopathy.  Skin:    General: Skin is warm and dry.     Capillary Refill: Capillary refill takes less than 2 seconds.     Coloration: Skin is not jaundiced or pale.     Findings: No bruising, erythema, lesion or rash.  Neurological:     General: No focal deficit present.     Mental Status: He is alert and oriented to person, place, and time. Mental status is at baseline.     Cranial Nerves: No cranial nerve deficit.     Sensory: No sensory deficit.     Motor: No weakness.     Coordination: Coordination normal.     Gait: Gait normal.     Deep Tendon Reflexes: Reflexes  normal.  Psychiatric:        Mood and Affect: Mood normal.        Behavior: Behavior normal.        Thought Content: Thought content normal.        Judgment: Judgment normal.    SCANS: His abdominal MRI revealed the following: FINDINGS: Lower chest: Lung bases without signs of effusion or evidence of consolidative process.   Hepatobiliary: Area of subtle hypointensity on T1 anterior to the porta hepatis may display some mild steatotic changes. Arterial phase enhancement measuring 1.8 cm (image 24/6) fissural widening of the liver with LEFT lobe hypertrophy is similar to prior studies. Arterial phase is mildly degraded secondary to respiratory motion. No additional arterial enhancing foci are noted.   On equilibrium phase this area is nearly isointense to surrounding liver. Only minimally hypointense on venous phase. Area is nearly isointense on T2 weighted imaging and without signs of restricted diffusion. On hepatobiliary phase near uniform isointensity to adjacent liver with band of hypointensity tracking along the posterior margin of the pre portal liver (image 43/17) no masslike features exhibited on hepatobiliary phase.   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   Small cyst in the RIGHT hepatic lobe (image 5/13) this is stable compatible with a benign lesion.   No pericholecystic stranding or signs of biliary duct distension. Small amount of sludge in the dependent gallbladder   Pancreas: Normal intrinsic T1 signal. No ductal dilation or sign of inflammation. No focal lesion.   Spleen: Marked splenomegaly up to 23 cm greatest craniocaudal dimension.   Adrenals/Urinary Tract: RIGHT kidney is  normal. Imaged portions the LEFT kidney are unremarkable. The kidney displaced inferiorly by large spleen. Adrenal glands are normal.   Stomach/Bowel: Unremarkable to the extent evaluated on abdominal MRI.   Vascular/Lymphatic: Portosystemic collaterals and signs of splenic-portal venous thrombosis.   Other:  No ascites   Musculoskeletal: No suspicious bone lesions identified.   IMPRESSION: 1. Arterial phase enhancement in the pre portal central liver without masslike features on hepatic biliary phase with central bandlike area of hypointensity which may represent an area of fibrosis or small vessel along the margin of the liver. Findings are favored to represent transient intensity differences due to perfusional anomaly, categorized  as LI-RADS category 3 given more focal appearance on the arterial phase. Three-month follow-up is suggested to document stability with further imaging based on follow-up assessment. 2. Signs of portal venous hypertension with abundant varices in the stomach, distal esophagus and throughout the upper abdomen. Also associated with more marked splenomegaly. 3. 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 4. Small amount of sludge in the dependent gallbladder.    LABS:      Latest Ref Rng & Units 12/02/2021   12:00 AM 08/11/2021    7:38 AM 07/13/2021   12:00 AM  CBC  WBC  1.6     1.5 Repeated and verified X2.  1.6   Hemoglobin 13.5 - 17.5 13.4     11.7  9.1   Hematocrit 41 - 53 40     35.4  29   Platelets 150 - 400 K/uL 40     51.0  53      This result is from an external source.      Latest Ref Rng & Units 12/02/2021   12:00 AM 07/13/2021   12:00 AM  CMP  BUN 4 - '21 6     16   '$ Creatinine 0.6 - 1.3 0.5     0.6   Sodium 137 - 147 133     137   Potassium 3.5 - 5.1 mEq/L  4.2     4.3   Chloride 99 - 108 102     101   CO2 13 - '22 22     28   '$ Calcium 8.7 - 10.7 8.6     8.7   Alkaline Phos 25 - 125 89     80   AST 14 - 40 26     37   ALT 10 - 40 U/L 24     22      This result is from an external source.    Latest Reference Range & Units Most Recent 07/13/21 11:28  Iron 45 - 182 ug/dL 108 12/02/21 09:26 231 (H)  UIBC ug/dL 274 12/02/21 09:26 264  TIBC 250 - 450 ug/dL 382 12/02/21 09:26 495 (H)  Saturation Ratios 17.9 - 39.5 % 28 12/02/21 09:26 47 (H)  Ferritin 24 - 336 ng/mL 15 (L) 12/02/21 09:26 7 (L)  (H): Data is abnormally high (L): Data is abnormally low  ASSESSMENT & PLAN:  A 56 y.o. male whose pancytopenia appears to be secondary to his marked cirrhosis and secondary splenomegaly.  When evaluating his labs, I am pleased that his hemoglobin is better today.  This reflects the benefit CBC from his recent IV iron.  He understands pancytopenia will always remain an issue with his native cirrhotic liver and secondary splenomegaly.  However, he is relatively stable from a hematologic perspective.  Based upon this, I will see him back in 6 months for repeat clinical assessment.  B.  The patient understands all the plans discussed today and is in agreement with them.  Juvia Aerts Macarthur Critchley, MD

## 2021-12-02 ENCOUNTER — Inpatient Hospital Stay: Payer: Commercial Managed Care - HMO

## 2021-12-02 ENCOUNTER — Inpatient Hospital Stay: Payer: Commercial Managed Care - HMO | Attending: Oncology | Admitting: Oncology

## 2021-12-02 ENCOUNTER — Telehealth: Payer: Self-pay

## 2021-12-02 VITALS — BP 130/69 | HR 81 | Temp 98.7°F | Resp 18 | Ht 66.0 in | Wt 166.7 lb

## 2021-12-02 DIAGNOSIS — K7460 Unspecified cirrhosis of liver: Secondary | ICD-10-CM | POA: Insufficient documentation

## 2021-12-02 DIAGNOSIS — I81 Portal vein thrombosis: Secondary | ICD-10-CM | POA: Diagnosis not present

## 2021-12-02 DIAGNOSIS — D509 Iron deficiency anemia, unspecified: Secondary | ICD-10-CM | POA: Insufficient documentation

## 2021-12-02 DIAGNOSIS — Z79899 Other long term (current) drug therapy: Secondary | ICD-10-CM | POA: Insufficient documentation

## 2021-12-02 DIAGNOSIS — D61818 Other pancytopenia: Secondary | ICD-10-CM | POA: Diagnosis not present

## 2021-12-02 DIAGNOSIS — D508 Other iron deficiency anemias: Secondary | ICD-10-CM

## 2021-12-02 LAB — BASIC METABOLIC PANEL
BUN: 6 (ref 4–21)
CO2: 22 (ref 13–22)
Chloride: 102 (ref 99–108)
Creatinine: 0.5 — AB (ref 0.6–1.3)
Glucose: 391
Potassium: 4.2 mEq/L (ref 3.5–5.1)
Sodium: 133 — AB (ref 137–147)

## 2021-12-02 LAB — HEPATIC FUNCTION PANEL
ALT: 24 U/L (ref 10–40)
AST: 26 (ref 14–40)
Alkaline Phosphatase: 89 (ref 25–125)
Bilirubin, Total: 1.8

## 2021-12-02 LAB — IRON AND TIBC
Iron: 108 ug/dL (ref 45–182)
Saturation Ratios: 28 % (ref 17.9–39.5)
TIBC: 382 ug/dL (ref 250–450)
UIBC: 274 ug/dL

## 2021-12-02 LAB — CBC AND DIFFERENTIAL
HCT: 40 — AB (ref 41–53)
Hemoglobin: 13.4 — AB (ref 13.5–17.5)
Neutrophils Absolute: 0.98
Platelets: 40 10*3/uL — AB (ref 150–400)
WBC: 1.6

## 2021-12-02 LAB — COMPREHENSIVE METABOLIC PANEL
Albumin: 3.7 (ref 3.5–5.0)
Calcium: 8.6 — AB (ref 8.7–10.7)

## 2021-12-02 LAB — FERRITIN: Ferritin: 15 ng/mL — ABNORMAL LOW (ref 24–336)

## 2021-12-02 LAB — CBC: RBC: 4.33 (ref 3.87–5.11)

## 2021-12-02 NOTE — Telephone Encounter (Signed)
Called patient and let him know about a hospital opening on August 14. Patient stated he would put in for it and call me back if he can have it off.

## 2021-12-07 ENCOUNTER — Other Ambulatory Visit: Payer: Self-pay

## 2021-12-07 ENCOUNTER — Other Ambulatory Visit: Payer: Self-pay | Admitting: Oncology

## 2021-12-07 DIAGNOSIS — D61818 Other pancytopenia: Secondary | ICD-10-CM

## 2021-12-07 DIAGNOSIS — I8500 Esophageal varices without bleeding: Secondary | ICD-10-CM

## 2021-12-07 NOTE — Telephone Encounter (Signed)
Patient called yesterday and again today and wanted to let you know he got the approval to be off for the August 14 procedure.  Please call patient and advise.  Thank you.

## 2021-12-07 NOTE — Telephone Encounter (Signed)
Called patient and let him know that he has been scheduled for his procedure 01/02/22 @ 8:30am at [facility]

## 2021-12-26 ENCOUNTER — Encounter (HOSPITAL_COMMUNITY): Payer: Self-pay | Admitting: Gastroenterology

## 2022-01-01 NOTE — Anesthesia Preprocedure Evaluation (Addendum)
Anesthesia Evaluation  Patient identified by MRN, date of birth, ID band Patient awake    Reviewed: Allergy & Precautions, H&P , NPO status , Patient's Chart, lab work & pertinent test results  Airway Mallampati: I  TM Distance: >3 FB Neck ROM: full    Dental  (+) Edentulous Upper, Edentulous Lower   Pulmonary pneumonia, Current Smoker and Patient abstained from smoking.,    Pulmonary exam normal breath sounds clear to auscultation       Cardiovascular Exercise Tolerance: Good negative cardio ROS   Rhythm:regular Rate:Normal     Neuro/Psych  Headaches, PSYCHIATRIC DISORDERS Anxiety Depression    GI/Hepatic PUD, (+) Cirrhosis       ,   Endo/Other  diabetes, Type 2  Renal/GU Renal disease     Musculoskeletal   Abdominal   Peds  Hematology  (+) Blood dyscrasia, anemia ,   Anesthesia Other Findings   Reproductive/Obstetrics                            Anesthesia Physical  Anesthesia Plan  ASA: 3  Anesthesia Plan: MAC   Post-op Pain Management: Minimal or no pain anticipated   Induction: Intravenous  PONV Risk Score and Plan: 1 and Treatment may vary due to age or medical condition  Airway Management Planned: Natural Airway, Nasal Cannula and Simple Face Mask  Additional Equipment: None  Intra-op Plan:   Post-operative Plan:   Informed Consent: I have reviewed the patients History and Physical, chart, labs and discussed the procedure including the risks, benefits and alternatives for the proposed anesthesia with the patient or authorized representative who has indicated his/her understanding and acceptance.     Dental advisory given  Plan Discussed with: CRNA  Anesthesia Plan Comments:        Anesthesia Quick Evaluation

## 2022-01-02 ENCOUNTER — Ambulatory Visit (HOSPITAL_BASED_OUTPATIENT_CLINIC_OR_DEPARTMENT_OTHER): Payer: Commercial Managed Care - HMO | Admitting: Anesthesiology

## 2022-01-02 ENCOUNTER — Encounter (HOSPITAL_COMMUNITY): Payer: Self-pay | Admitting: Gastroenterology

## 2022-01-02 ENCOUNTER — Other Ambulatory Visit: Payer: Self-pay

## 2022-01-02 ENCOUNTER — Ambulatory Visit (HOSPITAL_COMMUNITY)
Admission: RE | Admit: 2022-01-02 | Discharge: 2022-01-02 | Disposition: A | Discharge status: Home / Self Care | Payer: Commercial Managed Care - HMO | Attending: Gastroenterology | Admitting: Gastroenterology

## 2022-01-02 ENCOUNTER — Ambulatory Visit (HOSPITAL_COMMUNITY): Payer: Commercial Managed Care - HMO | Admitting: Anesthesiology

## 2022-01-02 ENCOUNTER — Encounter (HOSPITAL_COMMUNITY)
Admission: RE | Disposition: A | Discharge status: Home / Self Care | Payer: Self-pay | Source: Home / Self Care | Attending: Gastroenterology

## 2022-01-02 DIAGNOSIS — K3189 Other diseases of stomach and duodenum: Secondary | ICD-10-CM | POA: Insufficient documentation

## 2022-01-02 DIAGNOSIS — F17210 Nicotine dependence, cigarettes, uncomplicated: Secondary | ICD-10-CM | POA: Insufficient documentation

## 2022-01-02 DIAGNOSIS — K2950 Unspecified chronic gastritis without bleeding: Secondary | ICD-10-CM | POA: Insufficient documentation

## 2022-01-02 DIAGNOSIS — F418 Other specified anxiety disorders: Secondary | ICD-10-CM

## 2022-01-02 DIAGNOSIS — K259 Gastric ulcer, unspecified as acute or chronic, without hemorrhage or perforation: Secondary | ICD-10-CM | POA: Insufficient documentation

## 2022-01-02 DIAGNOSIS — I8510 Secondary esophageal varices without bleeding: Secondary | ICD-10-CM | POA: Insufficient documentation

## 2022-01-02 DIAGNOSIS — I8500 Esophageal varices without bleeding: Secondary | ICD-10-CM

## 2022-01-02 DIAGNOSIS — I864 Gastric varices: Secondary | ICD-10-CM | POA: Diagnosis present

## 2022-01-02 DIAGNOSIS — K766 Portal hypertension: Secondary | ICD-10-CM | POA: Diagnosis not present

## 2022-01-02 DIAGNOSIS — E119 Type 2 diabetes mellitus without complications: Secondary | ICD-10-CM | POA: Diagnosis not present

## 2022-01-02 HISTORY — PX: ESOPHAGEAL BANDING: SHX5518

## 2022-01-02 HISTORY — PX: BIOPSY: SHX5522

## 2022-01-02 HISTORY — PX: ESOPHAGOGASTRODUODENOSCOPY (EGD) WITH PROPOFOL: SHX5813

## 2022-01-02 SURGERY — ESOPHAGOGASTRODUODENOSCOPY (EGD) WITH PROPOFOL
Anesthesia: Monitor Anesthesia Care

## 2022-01-02 MED ORDER — PROPOFOL 500 MG/50ML IV EMUL
INTRAVENOUS | Status: AC
  Filled 2022-01-02: qty 50

## 2022-01-02 MED ORDER — PROPOFOL 500 MG/50ML IV EMUL
INTRAVENOUS | Status: DC | PRN
  Administered 2022-01-02: 125 ug/kg/min via INTRAVENOUS

## 2022-01-02 MED ORDER — LIDOCAINE 2% (20 MG/ML) 5 ML SYRINGE
INTRAMUSCULAR | Status: DC | PRN
  Administered 2022-01-02: 60 mg via INTRAVENOUS

## 2022-01-02 MED ORDER — LACTATED RINGERS IV SOLN
INTRAVENOUS | Status: DC
  Administered 2022-01-02: 1000 mL via INTRAVENOUS

## 2022-01-02 MED ORDER — PANTOPRAZOLE SODIUM 40 MG PO TBEC: 40.0000 mg | DELAYED_RELEASE_TABLET | Freq: Every day | ORAL | 1 refills | Status: DC

## 2022-01-02 MED ORDER — SODIUM CHLORIDE 0.9 % IV SOLN: INTRAVENOUS | Status: DC

## 2022-01-02 MED ORDER — PROPOFOL 10 MG/ML IV BOLUS
INTRAVENOUS | Status: AC
  Filled 2022-01-02: qty 20

## 2022-01-02 MED ORDER — PROPOFOL 10 MG/ML IV BOLUS
INTRAVENOUS | Status: DC | PRN
  Administered 2022-01-02 (×2): 20 mg via INTRAVENOUS
  Administered 2022-01-02: 30 mg via INTRAVENOUS
  Administered 2022-01-02: 20 mg via INTRAVENOUS

## 2022-01-02 SURGICAL SUPPLY — 15 items

## 2022-01-02 NOTE — Anesthesia Postprocedure Evaluation (Signed)
Anesthesia Post Note  Patient: Colton Leon  Procedure(s) Performed: ESOPHAGOGASTRODUODENOSCOPY (EGD) WITH PROPOFOL BIOPSY ESOPHAGEAL BANDING     Patient location during evaluation: PACU Anesthesia Type: MAC Level of consciousness: awake and alert Pain management: pain level controlled Vital Signs Assessment: post-procedure vital signs reviewed and stable Respiratory status: spontaneous breathing Cardiovascular status: stable Anesthetic complications: no   No notable events documented.  Last Vitals:  Vitals:   01/02/22 0927 01/02/22 0937  BP: 135/74 133/70  Pulse: 74 76  Resp: 18 19  Temp:    SpO2:      Last Pain:  Vitals:   01/02/22 0937  TempSrc:   PainSc: 0-No pain                 Nolon Nations

## 2022-01-02 NOTE — Op Note (Signed)
[facility] Patient Name: Colton Leon Procedure Date: 01/02/2022 MRN: [id number] Attending MD: Gladstone Pih. Candis Schatz , MD Date of Birth: 26-Aug-1965 CSN: [id number] Age: 56 Admit Type: Outpatient Procedure:                Upper GI endoscopy Indications:              For therapy of esophageal varices Providers:                Nicki Reaper E. Candis Schatz, MD, Mikey College, RN,                            Benetta Spar, Technician Referring MD:              Medicines:                Monitored Anesthesia Care Complications:            No immediate complications. Estimated Blood Loss:     Estimated blood loss was minimal. Procedure:                Pre-Anesthesia Assessment:                           - Prior to the procedure, a History and Physical                            was performed, and patient medications and                            allergies were reviewed. The patient's tolerance of                            previous anesthesia was also reviewed. The risks                            and benefits of the procedure and the sedation                            options and risks were discussed with the patient.                            All questions were answered, and informed consent                            was obtained. Prior Anticoagulants: The patient has                            taken no previous anticoagulant or antiplatelet                            agents. ASA Grade Assessment: III - A patient with                            severe systemic disease. After reviewing the risks  and benefits, the patient was deemed in                            satisfactory condition to undergo the procedure.                           After obtaining informed consent, the endoscope was                            passed under direct vision. Throughout the                            procedure, the patient's blood pressure, pulse, and                             oxygen saturations were monitored continuously. The                            [id number]-H190 ([id number]) Olympus endoscope was introduced                            through the mouth, and advanced to the second part                            of duodenum. The upper GI endoscopy was                            accomplished without difficulty. The patient                            tolerated the procedure well. Scope In: Scope Out: Findings:      The examined portions of the nasopharynx, oropharynx and larynx were       normal.      Grade III varices were found in the middle third of the esophagus and in       the lower third of the esophagus. They were large in size. Four bands       were successfully placed with incomplete eradication of varices. There       was slight oozing with the third band which appeared to have resolved       before the procedure was complete.      Few non-bleeding superficial gastric ulcers with no stigmata of bleeding       were found in the gastric antrum. The largest lesion was 10 mm in       largest dimension. Biopsies were taken with a cold forceps for       Helicobacter pylori testing. The biopsy sites oozed more than expected,       likely related to his thrombocytopenia. Estimated blood loss was minimal.      Mild portal hypertensive gastropathy was found in the gastric body.      Type 2 gastroesophageal varices (GOV2, esophageal varices which extend       along the fundus) with no bleeding were found in the cardia. They were       medium in largest diameter.      The examined duodenum was normal. Impression:               -  The examined portions of the nasopharynx,                            oropharynx and larynx were normal.                           - Grade III esophageal varices. Incompletely                            eradicated. Banded.                           - Non-bleeding gastric ulcers with no stigmata of                            bleeding.  Biopsied.                           - Portal hypertensive gastropathy.                           - Type 2 gastroesophageal varices (GOV2, esophageal                            varices which extend along the fundus), without                            bleeding.                           - Normal examined duodenum. Moderate Sedation:      Not Applicable - Patient had care per Anesthesia. Recommendation:           - Patient has a contact number available for                            emergencies. The signs and symptoms of potential                            delayed complications were discussed with the                            patient. Return to normal activities tomorrow.                            Written discharge instructions were provided to the                            patient.                           - Resume previous diet.                           - Continue present medications.                           -  Await pathology results.                           - Repeat upper endoscopy in 4-6 weeks for repeat                            banding.                           - Use Prilosec (omeprazole) 40 mg PO daily for 8                            weeks.                           - Avoid all NSAIDs Procedure Code(s):        --- Professional ---                           [phone], Esophagogastroduodenoscopy, flexible,                            transoral; with band ligation of esophageal/gastric                            varices                           43239, Esophagogastroduodenoscopy, flexible,                            transoral; with biopsy, single or multiple Diagnosis Code(s):        --- Professional ---                           I85.00, Esophageal varices without bleeding                           K25.9, Gastric ulcer, unspecified as acute or                            chronic, without hemorrhage or perforation                           K76.6, Portal hypertension                            K31.89, Other diseases of stomach and duodenum                           I86.4, Gastric varices CPT copyright 2019 [employer]. All rights reserved. The codes documented in this report are preliminary and upon coder review may  be revised to meet current compliance requirements. Marli Diego E. Candis Schatz, MD 01/02/2022 9:22:54 AM This report has been signed electronically. Number of Addenda: 0

## 2022-01-02 NOTE — Discharge Instructions (Signed)
YOU HAD AN ENDOSCOPIC PROCEDURE TODAY: Refer to the procedure report and other information in the discharge instructions given to you for any specific questions about what was found during the examination. If this information does not answer your questions, please call [place] office at [phone] to clarify.  ° °YOU SHOULD EXPECT: Some feelings of bloating in the abdomen. Passage of more gas than usual. Walking can help get rid of the air that was put into your GI tract during the procedure and reduce the bloating.  ° °DIET: Your first meal following the procedure should be a light meal and then it is ok to progress to your normal diet. A half-sandwich or bowl of soup is an example of a good first meal. Heavy or fried foods are harder to digest and may make you feel nauseous or bloated. Drink plenty of fluids but you should avoid alcoholic beverages for 24 hours. ° °ACTIVITY: Your care partner should take you home directly after the procedure. You should plan to take it easy, moving slowly for the rest of the day. You can resume normal activity the day after the procedure however YOU SHOULD NOT DRIVE, use power tools, machinery or perform tasks that involve climbing or major physical exertion for 24 hours (because of the sedation medicines used during the test).  ° °SYMPTOMS TO REPORT IMMEDIATELY: °A gastroenterologist can be reached at any hour. Please call [phone]  for any of the following symptoms:  ° °Following upper endoscopy (EGD, EUS, ERCP, esophageal dilation) °Vomiting of blood or coffee ground material  °New, significant abdominal pain  °New, significant chest pain or pain under the shoulder blades  °Painful or persistently difficult swallowing  °New shortness of breath  °Black, tarry-looking or red, bloody stools ° °FOLLOW UP:  °If any biopsies were taken you will be contacted by phone or by letter within the next 1-3 weeks. Call [phone]  if you have not heard about the biopsies in 3 weeks.    °Please also call with any specific questions about appointments or follow up tests. ° °

## 2022-01-02 NOTE — Transfer of Care (Signed)
Immediate Anesthesia Transfer of Care Note  Patient: Colton Leon  Procedure(s) Performed: ESOPHAGOGASTRODUODENOSCOPY (EGD) WITH PROPOFOL  Patient Location: Endoscopy Unit  Anesthesia Type:MAC  Level of Consciousness: drowsy  Airway & Oxygen Therapy: Patient Spontanous Breathing and Patient connected to face mask oxygen  Post-op Assessment: Report given to RN and Post -op Vital signs reviewed and stable  Post vital signs: Reviewed and stable  Last Vitals:  Vitals Value Taken Time  BP    Temp    Pulse    Resp    SpO2      Last Pain:  Vitals:   01/02/22 0727  TempSrc: Tympanic  PainSc: 0-No pain         Complications: No notable events documented.

## 2022-01-02 NOTE — Anesthesia Procedure Notes (Signed)
Date/Time: 01/02/2022 8:49 AM  Performed by: Sharlette Dense, CRNAOxygen Delivery Method: Simple face mask

## 2022-01-02 NOTE — H&P (Signed)
[facility] History and Physical   Primary Care Physician:  Street, Sharon Mt, MD   Reason for Procedure:   Therapy of esophageal varices  Plan:    EGD with variceal band ligation     HPI: Colton Leon is a 56 y.o. male undergoing repeat EGD and banding of large esophageal varices.  He last underwent banding July 3rd and 5 bands were placed.  This is his third banding session.   Past Medical History:  Diagnosis Date   ADHD    Anxiety and depression    Blood in stool    hx of   Cirrhosis ([place])    Depression    Headache    Hematuria    hx of   Hernia of abdominal cavity    Hyperlipidemia    Kidney stones    Neoplasm of uncertain behavior of liver    Neoplasm of uncertain behavior of liver    Pancytopenia (HCC)    Peptic ulcer    Pneumonia    Portal hypertension (HCC)    Portal vein thrombosis    Shortness of breath on exertion    Symptom of blood in vomit    hx of   Type 2 diabetes mellitus ([place])     Past Surgical History:  Procedure Laterality Date   CIRCUMCISION     dental surgeries     ESOPHAGEAL BANDING N/A 08/16/2021   Procedure: ESOPHAGEAL BANDING;  Surgeon: Doran Stabler, MD;  Location: [facility];  Service: Gastroenterology;  Laterality: N/A;   ESOPHAGEAL BANDING  11/21/2021   Procedure: ESOPHAGEAL BANDING;  Surgeon: [name], MD;  Location: [facility];  Service: Gastroenterology;;   ESOPHAGOGASTRODUODENOSCOPY (EGD) WITH PROPOFOL N/A 08/16/2021   Procedure: ESOPHAGOGASTRODUODENOSCOPY (EGD) WITH PROPOFOL;  Surgeon: Doran Stabler, MD;  Location: [facility];  Service: Gastroenterology;  Laterality: N/A;   ESOPHAGOGASTRODUODENOSCOPY (EGD) WITH PROPOFOL N/A 11/21/2021   Procedure: ESOPHAGOGASTRODUODENOSCOPY (EGD) WITH PROPOFOL;  Surgeon: [name], MD;  Location: [facility];  Service: Gastroenterology;  Laterality: N/A;   TONSILLECTOMY     URETHRAL STRICTURE DILATATION      Prior to Admission  medications   Medication Sig Start Date End Date Taking? Authorizing Provider  acetaminophen (TYLENOL) 500 MG tablet Take 500 mg by mouth every 6 (six) hours as needed for moderate pain.   Yes [provider]  buPROPion [facility] SR) 150 MG 12 hr tablet Take 150 mg by mouth every Monday, Wednesday, and Friday. 10/07/21  Yes [provider]  ferrous sulfate 325 (65 FE) MG tablet Take 325 mg by mouth every evening. 07/01/21 12/28/21 Yes [provider]  furosemide (LASIX) 20 MG tablet Take 60 mg by mouth every evening. 3 tablets (60 mg) by mouth in the afternoon. 06/01/21 06/01/22 Yes [provider]  spironolactone (ALDACTONE) 50 MG tablet Take 100 mg by mouth every evening. 06/01/21 06/01/22 Yes [provider]    Current Facility-Administered Medications  Medication Dose Route Frequency Provider Last Rate Last Admin   0.9 %  sodium chloride infusion   Intravenous Continuous [name], MD       lactated ringers infusion   Intravenous Continuous [name], MD 10 mL/hr at 01/02/22 0734 1,000 mL at 01/02/22 0734    Allergies as of 12/07/2021 - Review Complete 12/02/2021  Allergen Reaction Noted   Augmentin [amoxicillin-pot clavulanate] Shortness Of Breath 06/01/2021   Shellfish allergy Shortness Of Breath 06/01/2021    Family History  Problem Relation Age  of Onset   Hypertension Mother    Skin cancer Mother    Skin cancer Father    Emphysema Father    Bipolar disorder Father    Heart disease Father    Diabetes Sister    Hypertension Sister    Heart disease Maternal Grandmother    Skin cancer Maternal Grandfather    Heart disease Paternal Grandmother    Skin cancer Paternal Grandfather    Heart disease Paternal Grandfather    Autism spectrum disorder Daughter    Diabetes Nephew    Colon cancer Neg Hx     Social History   Socioeconomic History   Marital status: Divorced    Spouse name: Not on file   Number of  children: 2   Years of education: 10   Highest education level: GED or equivalent  Occupational History   Occupation: Transport planner  Tobacco Use   Smoking status: Every Day    Packs/day: 1.50    Years: 38.00    Total pack years: 57.00    Types: Cigarettes   Smokeless tobacco: Never  Vaping Use   Vaping Use: Never used  Substance and Sexual Activity   Alcohol use: Never   Drug use: Never   Sexual activity: Not on file  Other Topics Concern   Not on file  Social History Narrative   Not on file   Social Determinants of Health   Financial Resource Strain: Not on file  Food Insecurity: Not on file  Transportation Needs: Not on file  Physical Activity: Not on file  Stress: Not on file  Social Connections: Not on file  Intimate Partner Violence: Not on file    Review of Systems:  All other review of systems negative except as mentioned in the HPI.  Physical Exam: Vital signs BP 134/72   Pulse 80   Temp 98.2 F (36.8 C) (Tympanic)   Resp 10   Ht 5' 6"  (1.676 m)   Wt 75.6 kg   SpO2 98%   BMI 26.90 kg/m   General:   Alert,  Well-developed, well-nourished, pleasant and cooperative in NAD Airway:  Mallampati 2 Lungs:  Clear throughout to auscultation.   Heart:  Regular rate and rhythm; no murmurs, clicks, rubs,  or gallops. Abdomen:  Soft, nontender and nondistended. Normal bowel sounds.   Neuro/Psych:  Normal mood and affect. A and O x 3   Colton Leon E. Candis Schatz, MD [facility]

## 2022-01-03 ENCOUNTER — Encounter (HOSPITAL_COMMUNITY): Payer: Self-pay | Admitting: Gastroenterology

## 2022-01-03 LAB — SURGICAL PATHOLOGY

## 2022-01-03 NOTE — Progress Notes (Signed)
Colton Leon,  The biopsies taken from your stomach were notable for mild reactive gastropathy which is a common finding and often related to use of certain medications (usually NSAIDs), but there was no evidence of Helicobacter pylori infection. Please avoid all NSAIDs and take the Protonix every day for 8 weeks as prescribed.  We will contact you when we have another hospital endoscopy slot available for repeat banding.

## 2022-02-17 ENCOUNTER — Other Ambulatory Visit: Payer: Self-pay | Admitting: Nurse Practitioner

## 2022-02-17 DIAGNOSIS — D376 Neoplasm of uncertain behavior of liver, gallbladder and bile ducts: Secondary | ICD-10-CM

## 2022-02-24 ENCOUNTER — Other Ambulatory Visit: Payer: Self-pay

## 2022-02-24 DIAGNOSIS — I8500 Esophageal varices without bleeding: Secondary | ICD-10-CM

## 2022-02-26 ENCOUNTER — Other Ambulatory Visit: Payer: Self-pay | Admitting: Gastroenterology

## 2022-03-30 ENCOUNTER — Encounter (HOSPITAL_COMMUNITY): Payer: Self-pay | Admitting: Gastroenterology

## 2022-03-30 ENCOUNTER — Other Ambulatory Visit: Payer: Self-pay

## 2022-03-31 ENCOUNTER — Ambulatory Visit
Admission: RE | Admit: 2022-03-31 | Discharge: 2022-03-31 | Disposition: A | Discharge status: Home / Self Care | Payer: Commercial Managed Care - HMO | Source: Ambulatory Visit | Attending: Nurse Practitioner | Admitting: Nurse Practitioner

## 2022-03-31 DIAGNOSIS — D376 Neoplasm of uncertain behavior of liver, gallbladder and bile ducts: Secondary | ICD-10-CM

## 2022-03-31 MED ORDER — IOPAMIDOL (ISOVUE-300) INJECTION 61%
100.0000 mL | Freq: Once | INTRAVENOUS | Status: AC | PRN
  Administered 2022-03-31: 100 mL via INTRAVENOUS

## 2022-04-03 ENCOUNTER — Other Ambulatory Visit: Payer: Self-pay | Admitting: Gastroenterology

## 2022-04-09 NOTE — Anesthesia Preprocedure Evaluation (Signed)
Anesthesia Evaluation  Patient identified by MRN, date of birth, ID band Patient awake    Reviewed: Allergy & Precautions, NPO status , Patient's Chart, lab work & pertinent test results  Airway Mallampati: I  TM Distance: >3 FB Neck ROM: Full    Dental no notable dental hx. (+) Edentulous Upper, Edentulous Lower   Pulmonary pneumonia (hx), Current SmokerPatient did not abstain from smoking.    + wheezing      Cardiovascular negative cardio ROS Normal cardiovascular exam Rhythm:Regular Rate:Normal     Neuro/Psych  Headaches  Anxiety        GI/Hepatic PUD,,,(+) Cirrhosis   Esophageal Varices      Endo/Other  diabetes, Type 2    Renal/GU      Musculoskeletal   Abdominal   Peds  Hematology   Anesthesia Other Findings All: Augmentin Shelfish  Reproductive/Obstetrics                             Anesthesia Physical Anesthesia Plan  ASA: 4  Anesthesia Plan: MAC   Post-op Pain Management:    Induction: Intravenous  PONV Risk Score and Plan: 0 and Treatment may vary due to age or medical condition  Airway Management Planned: Natural Airway and Nasal Cannula  Additional Equipment:   Intra-op Plan:   Post-operative Plan:   Informed Consent: I have reviewed the patients History and Physical, chart, labs and discussed the procedure including the risks, benefits and alternatives for the proposed anesthesia with the patient or authorized representative who has indicated his/her understanding and acceptance.     Dental advisory given  Plan Discussed with:   Anesthesia Plan Comments: (EGD for Varicies)       Anesthesia Quick Evaluation

## 2022-04-10 ENCOUNTER — Other Ambulatory Visit: Payer: Self-pay

## 2022-04-10 ENCOUNTER — Encounter (HOSPITAL_COMMUNITY)
Admission: RE | Disposition: A | Discharge status: Home / Self Care | Payer: Self-pay | Source: Home / Self Care | Attending: Gastroenterology

## 2022-04-10 ENCOUNTER — Ambulatory Visit (HOSPITAL_COMMUNITY)
Admission: RE | Admit: 2022-04-10 | Discharge: 2022-04-10 | Disposition: A | Discharge status: Home / Self Care | Payer: Commercial Managed Care - HMO | Attending: Gastroenterology | Admitting: Gastroenterology

## 2022-04-10 ENCOUNTER — Ambulatory Visit (HOSPITAL_BASED_OUTPATIENT_CLINIC_OR_DEPARTMENT_OTHER): Payer: Commercial Managed Care - HMO | Admitting: Anesthesiology

## 2022-04-10 ENCOUNTER — Ambulatory Visit (HOSPITAL_COMMUNITY): Payer: Commercial Managed Care - HMO | Admitting: Anesthesiology

## 2022-04-10 DIAGNOSIS — K766 Portal hypertension: Secondary | ICD-10-CM | POA: Diagnosis not present

## 2022-04-10 DIAGNOSIS — K7460 Unspecified cirrhosis of liver: Secondary | ICD-10-CM | POA: Diagnosis not present

## 2022-04-10 DIAGNOSIS — I8500 Esophageal varices without bleeding: Secondary | ICD-10-CM

## 2022-04-10 DIAGNOSIS — K3189 Other diseases of stomach and duodenum: Secondary | ICD-10-CM | POA: Diagnosis not present

## 2022-04-10 DIAGNOSIS — I8510 Secondary esophageal varices without bleeding: Secondary | ICD-10-CM | POA: Diagnosis not present

## 2022-04-10 DIAGNOSIS — E119 Type 2 diabetes mellitus without complications: Secondary | ICD-10-CM

## 2022-04-10 DIAGNOSIS — I864 Gastric varices: Secondary | ICD-10-CM | POA: Insufficient documentation

## 2022-04-10 HISTORY — DX: Personal history of urinary calculi: Z87.442

## 2022-04-10 HISTORY — DX: Esophageal varices without bleeding: I85.00

## 2022-04-10 HISTORY — PX: ESOPHAGOGASTRODUODENOSCOPY (EGD) WITH PROPOFOL: SHX5813

## 2022-04-10 HISTORY — PX: ESOPHAGEAL BANDING: SHX5518

## 2022-04-10 HISTORY — DX: Prediabetes: R73.03

## 2022-04-10 SURGERY — ESOPHAGOGASTRODUODENOSCOPY (EGD) WITH PROPOFOL
Anesthesia: Monitor Anesthesia Care

## 2022-04-10 MED ORDER — SODIUM CHLORIDE 0.9 % IV SOLN: INTRAVENOUS | Status: DC

## 2022-04-10 MED ORDER — DEXMEDETOMIDINE HCL IN NACL 80 MCG/20ML IV SOLN
INTRAVENOUS | Status: DC | PRN
  Administered 2022-04-10 (×2): 4 ug via BUCCAL

## 2022-04-10 MED ORDER — PROPOFOL 10 MG/ML IV BOLUS
INTRAVENOUS | Status: DC | PRN
  Administered 2022-04-10 (×2): 50 mg via INTRAVENOUS
  Administered 2022-04-10: 20 mg via INTRAVENOUS

## 2022-04-10 MED ORDER — PROPOFOL 500 MG/50ML IV EMUL
INTRAVENOUS | Status: DC | PRN
  Administered 2022-04-10: 150 ug/kg/min via INTRAVENOUS

## 2022-04-10 MED ORDER — PANTOPRAZOLE SODIUM 40 MG PO TBEC: 40.0000 mg | DELAYED_RELEASE_TABLET | Freq: Every day | ORAL | 0 refills | Status: DC

## 2022-04-10 MED ORDER — ONDANSETRON HCL 4 MG/2ML IJ SOLN
INTRAMUSCULAR | Status: DC | PRN
  Administered 2022-04-10: 4 mg via INTRAVENOUS

## 2022-04-10 MED ORDER — LIDOCAINE 2% (20 MG/ML) 5 ML SYRINGE
INTRAMUSCULAR | Status: DC | PRN
  Administered 2022-04-10: 100 mg via INTRAVENOUS

## 2022-04-10 MED ORDER — LACTATED RINGERS IV SOLN: INTRAVENOUS | Status: DC

## 2022-04-10 SURGICAL SUPPLY — 15 items

## 2022-04-10 NOTE — Discharge Instructions (Signed)
YOU HAD AN ENDOSCOPIC PROCEDURE TODAY: Refer to the procedure report and other information in the discharge instructions given to you for any specific questions about what was found during the examination. If this information does not answer your questions, please call [place] office at [phone] to clarify.   YOU SHOULD EXPECT: Some feelings of bloating in the abdomen. Passage of more gas than usual. Walking can help get rid of the air that was put into your GI tract during the procedure and reduce the bloating. If you had a lower endoscopy (such as a colonoscopy or flexible sigmoidoscopy) you may notice spotting of blood in your stool or on the toilet paper. Some abdominal soreness may be present for a day or two, also.  DIET: Your first meal following the procedure should be a light meal and then it is ok to progress to your normal diet. A half-sandwich or bowl of soup is an example of a good first meal. Heavy or fried foods are harder to digest and may make you feel nauseous or bloated. Drink plenty of fluids but you should avoid alcoholic beverages for 24 hours. If you had a esophageal dilation, please see attached instructions for diet.    ACTIVITY: Your care partner should take you home directly after the procedure. You should plan to take it easy, moving slowly for the rest of the day. You can resume normal activity the day after the procedure however YOU SHOULD NOT DRIVE, use power tools, machinery or perform tasks that involve climbing or major physical exertion for 24 hours (because of the sedation medicines used during the test).   SYMPTOMS TO REPORT IMMEDIATELY: A gastroenterologist can be reached at any hour. Please call [phone]  for any of the following symptoms:   Following upper endoscopy (EGD, EUS, ERCP, esophageal dilation) Vomiting of blood or coffee ground material  New, significant abdominal pain  New, significant chest pain or pain under the shoulder blades  Painful or  persistently difficult swallowing  New shortness of breath  Black, tarry-looking or red, bloody stools  FOLLOW UP:  If any biopsies were taken you will be contacted by phone or by letter within the next 1-3 weeks. Call [phone]  if you have not heard about the biopsies in 3 weeks.  Please also call with any specific questions about appointments or follow up tests.  

## 2022-04-10 NOTE — Op Note (Signed)
[facility] Patient Name: Colton Leon Procedure Date: 04/10/2022 MRN: [id number] Attending MD: Gladstone Pih. Candis Schatz , MD, [id number] Date of Birth: August 04, 1965 CSN: [id number] Age: 56 Admit Type: Outpatient Procedure:                Upper GI endoscopy Indications:              For therapy of esophageal varices Providers:                Nicki Reaper E. Candis Schatz, MD, Dulcy Fanny, Brien Mates, Technician Referring MD:              Medicines:                Monitored Anesthesia Care Complications:            No immediate complications. Estimated Blood Loss:     Estimated blood loss was minimal. Procedure:                Pre-Anesthesia Assessment:                           - Prior to the procedure, a History and Physical                            was performed, and patient medications and                            allergies were reviewed. The patient's tolerance of                            previous anesthesia was also reviewed. The risks                            and benefits of the procedure and the sedation                            options and risks were discussed with the patient.                            All questions were answered, and informed consent                            was obtained. Prior Anticoagulants: The patient has                            taken no anticoagulant or antiplatelet agents. ASA                            Grade Assessment: III - A patient with severe                            systemic disease. After reviewing the risks and  benefits, the patient was deemed in satisfactory                            condition to undergo the procedure.                           After obtaining informed consent, the endoscope was                            passed under direct vision. Throughout the                            procedure, the patient's blood pressure, pulse, and                             oxygen saturations were monitored continuously. The                            [id number]-H190 ([id number]) Olympus endoscope was introduced                            through the mouth, and advanced to the second part                            of duodenum. The upper GI endoscopy was                            accomplished without difficulty. The patient                            tolerated the procedure well. Scope In: Scope Out: Findings:      Grade III varices were found in the middle third of the esophagus and in       the lower third of the esophagus. They were large in size. Four bands       were successfully placed with incomplete eradication of varices. There       was no bleeding at the end of the procedure.      The exam of the esophagus was otherwise normal.      Type 2 gastroesophageal varices (GOV2, esophageal varices which extend       along the fundus) with no bleeding were found in the cardia.      Mild portal hypertensive gastropathy was found in the gastric fundus and       in the gastric body.      The exam of the stomach was otherwise normal.      The examined duodenum was normal. Impression:               - Grade III esophageal varices. Incompletely                            eradicated. Banded x4.                           - Type 2 gastroesophageal varices (GOV2, esophageal  varices which extend along the fundus), without                            bleeding.                           - Portal hypertensive gastropathy.                           - Normal examined duodenum.                           - No specimens collected. Moderate Sedation:      Not Applicable - Patient had care per Anesthesia. Recommendation:           - Patient has a contact number available for                            emergencies. The signs and symptoms of potential                            delayed complications were discussed with the                             patient. Return to normal activities tomorrow.                            Written discharge instructions were provided to the                            patient.                           - Full liquid diet today, then soft diet for 3-4                            days.                           - Continue present medications.                           - Repeat upper endoscopy in 4-6 weeks for                            retreatment.                           - Use Prilosec (omeprazole) 40 mg PO daily for 8                            weeks.                           - Avoid all NSAIDs                           - Given  extensive varices despite multiple banding                            sessions and severe thrombocytopenia, will discuss                            with patient's hepatologist the possibility of TIPS                            for variceal prophylaxis. Procedure Code(s):        --- Professional ---                           [phone], Esophagogastroduodenoscopy, flexible,                            transoral; with band ligation of esophageal/gastric                            varices Diagnosis Code(s):        --- Professional ---                           I85.00, Esophageal varices without bleeding                           K76.6, Portal hypertension                           K31.89, Other diseases of stomach and duodenum CPT copyright 2022 American Medical Association. All rights reserved. The codes documented in this report are preliminary and upon coder review may  be revised to meet current compliance requirements. Trishia Cuthrell E. Candis Schatz, MD 04/10/2022 10:41:46 AM This report has been signed electronically. Number of Addenda: 0

## 2022-04-10 NOTE — Anesthesia Procedure Notes (Signed)
Procedure Name: MAC Date/Time: 04/10/2022 10:11 AM  Performed by: Deliah Boston, CRNAPre-anesthesia Checklist: Patient identified, Emergency Drugs available, Suction available and Patient being monitored Patient Re-evaluated:Patient Re-evaluated prior to induction Oxygen Delivery Method: Simple face mask Preoxygenation: Pre-oxygenation with 100% oxygen Placement Confirmation: positive ETCO2 and breath sounds checked- equal and bilateral

## 2022-04-10 NOTE — Transfer of Care (Signed)
Immediate Anesthesia Transfer of Care Note  Patient: Colton Leon  Procedure(s) Performed: Procedure(s): ESOPHAGOGASTRODUODENOSCOPY (EGD) WITH PROPOFOL (N/A)  Patient Location: PACU  Anesthesia Type:MAC  Level of Consciousness: Patient easily awoken, sedated, comfortable, cooperative, following commands, responds to stimulation.   Airway & Oxygen Therapy: Patient spontaneously breathing, ventilating well, oxygen via simple oxygen mask.  Post-op Assessment: Report given to PACU RN, vital signs reviewed and stable, moving all extremities.   Post vital signs: Reviewed and stable.  Complications: No apparent anesthesia complications Last Vitals:  Vitals Value Taken Time  BP 130/64 04/10/22 1031  Temp    Pulse 79 04/10/22 1032  Resp 17 04/10/22 1032  SpO2 100 % 04/10/22 1032  Vitals shown include unvalidated device data.  Last Pain:  Vitals:   04/10/22 0751  TempSrc: Temporal  PainSc: 3       Patients Stated Pain Goal: 3 (97/67/34 1937)  Complications: No notable events documented.

## 2022-04-10 NOTE — H&P (Signed)
[facility] History and Physical   Primary Care Physician:  Street, Sharon Mt, MD   Reason for Procedure:   Therapy of esophageal varices  Plan:    EGD with variceal band ligation     HPI: Colton Leon is a 56 y.o. male undergoing repeat EGD with variceal banding.  He has undergone three previous banding sessions thus far, most recently in August.  He has chronic thrombocytopenia with baseline platelet level 40-50.  We discussed the increased risk of bleeding due to his thrombocytopenia and the patient agrees to proceed with prophylactic banding.  He has a PVT and his hepatologist plans to anticoagulate him following successful banding of his varices.   Past Medical History:  Diagnosis Date   ADHD    Anxiety and depression    Blood in stool    hx of   Cirrhosis ([place])    Depression    Headache    Hematuria    hx of   Hernia of abdominal cavity    History of kidney stones    Hyperlipidemia    Neoplasm of uncertain behavior of liver    Neoplasm of uncertain behavior of liver    Pancytopenia (HCC)    iron   Peptic ulcer    Pneumonia    Portal hypertension (HCC)    Portal vein thrombosis    Pre-diabetes    Shortness of breath on exertion    Symptom of blood in vomit    hx of   Varices, esophageal ([place])     Past Surgical History:  Procedure Laterality Date   BIOPSY  01/02/2022   Procedure: BIOPSY;  Surgeon: [name], MD;  Location: [facility];  Service: Gastroenterology;;   CIRCUMCISION     dental surgeries     ESOPHAGEAL BANDING N/A 08/16/2021   Procedure: ESOPHAGEAL BANDING;  Surgeon: Doran Stabler, MD;  [facility];  Service: Gastroenterology;  Laterality: N/A;   ESOPHAGEAL BANDING  11/21/2021   Procedure: ESOPHAGEAL BANDING;  Surgeon: [name], MD;  Location: [facility];  Service: Gastroenterology;;   ESOPHAGEAL BANDING  01/02/2022   Procedure: ESOPHAGEAL BANDING;  Surgeon: [name], MD;   Location: [facility];  Service: Gastroenterology;;   ESOPHAGOGASTRODUODENOSCOPY (EGD) WITH PROPOFOL N/A 08/16/2021   Procedure: ESOPHAGOGASTRODUODENOSCOPY (EGD) WITH PROPOFOL;  Surgeon: Doran Stabler, MD;  [facility];  Service: Gastroenterology;  Laterality: N/A;   ESOPHAGOGASTRODUODENOSCOPY (EGD) WITH PROPOFOL N/A 11/21/2021   Procedure: ESOPHAGOGASTRODUODENOSCOPY (EGD) WITH PROPOFOL;  Surgeon: [name], MD;  [facility]: [facility];  Service: Gastroenterology;  Laterality: N/A;   ESOPHAGOGASTRODUODENOSCOPY (EGD) WITH PROPOFOL N/A 01/02/2022   Procedure: ESOPHAGOGASTRODUODENOSCOPY (EGD) WITH PROPOFOL;  Surgeon: [name], MD;  [facility];  Service: Gastroenterology;  Laterality: N/A;   TONSILLECTOMY     URETHRAL STRICTURE DILATATION      Prior to Admission medications   Medication Sig Start Date End Date Taking? Authorizing Provider  buPROPion (WELLBUTRIN SR) 150 MG 12 hr tablet Take 150 mg by mouth daily. In the afternoon 10/07/21  Yes [provider]  furosemide (LASIX) 20 MG tablet Take 40-60 mg by mouth See admin instructions. 40 mg in the morning, 60 mg in the afternoon (1700) 06/01/21  Yes [provider]  pantoprazole (PROTONIX) 40 MG tablet Take 1 tablet by mouth once daily 04/03/22  Yes [name], MD  spironolactone (ALDACTONE) 50 MG tablet Take 50-100 mg by mouth See admin instructions. 50 mg in the afternoon  about 1700 Mon-Fri, and 100 mg in the afternoon on Sat and Sun 06/01/21  Yes [provider]  acetaminophen (TYLENOL) 500 MG tablet Take 500 mg by mouth every 6 (six) hours as needed for moderate pain.    [provider]    Current Facility-Administered Medications  Medication Dose Route Frequency Provider Last Rate Last Admin   0.9 %  sodium chloride infusion   Intravenous Continuous [name], MD       lactated ringers infusion   Intravenous Continuous [name], MD  10 mL/hr at 04/10/22 0932 Continued from Pre-op at 04/10/22 0932    Allergies as of 02/24/2022 - Review Complete 01/02/2022  Allergen Reaction Noted   Augmentin [amoxicillin-pot clavulanate] Shortness Of Breath 06/01/2021   Shellfish allergy Shortness Of Breath 06/01/2021    Family History  Problem Relation Age of Onset   Hypertension Mother    Skin cancer Mother    Skin cancer Father    Emphysema Father    Bipolar disorder Father    Heart disease Father    Diabetes Sister    Hypertension Sister    Heart disease Maternal Grandmother    Skin cancer Maternal Grandfather    Heart disease Paternal Grandmother    Skin cancer Paternal Grandfather    Heart disease Paternal Grandfather    Autism spectrum disorder Daughter    Diabetes Nephew    Colon cancer Neg Hx     Social History   Socioeconomic History   Marital status: Divorced    Spouse name: Not on file   Number of children: 2   Years of education: 10   Highest education level: GED or equivalent  Occupational History   Occupation: Transport planner  Tobacco Use   Smoking status: Every Day    Packs/day: 1.00    Years: 38.00    Total pack years: 38.00    Types: Cigarettes   Smokeless tobacco: Never  Vaping Use   Vaping Use: Never used  Substance and Sexual Activity   Alcohol use: Never   Drug use: Never   Sexual activity: Not Currently  Other Topics Concern   Not on file  Social History Narrative   Not on file   Social Determinants of Health   Financial Resource Strain: Not on file  Food Insecurity: Not on file  Transportation Needs: Not on file  Physical Activity: Not on file  Stress: Not on file  Social Connections: Not on file  Intimate Partner Violence: Not on file    Review of Systems:  All other review of systems negative except as mentioned in the HPI.  Physical Exam: Vital signs BP 132/71   Pulse 75   Temp 98.1 F (36.7 C) (Temporal)   Resp 10   Ht 5' 6.5" (1.689 m)   Wt 70.3 kg   SpO2  99%   BMI 24.64 kg/m   General:   Alert,  Well-developed, well-nourished, pleasant and cooperative in NAD Airway:  Mallampati 2 Lungs:  Clear throughout to auscultation.   Heart:  Regular rate and rhythm; no murmurs, clicks, rubs,  or gallops. Abdomen:  Soft, nontender and nondistended. Normal bowel sounds.   Neuro/Psych:  Normal mood and affect. A and O x 3   Ayse Mccartin E. Candis Schatz, MD [facility]

## 2022-04-10 NOTE — Anesthesia Postprocedure Evaluation (Signed)
Anesthesia Post Note  Patient: Colton Leon  Procedure(s) Performed: ESOPHAGOGASTRODUODENOSCOPY (EGD) WITH PROPOFOL     Patient location during evaluation: Endoscopy Anesthesia Type: MAC Level of consciousness: awake and alert Pain management: pain level controlled Vital Signs Assessment: post-procedure vital signs reviewed and stable Respiratory status: spontaneous breathing, nonlabored ventilation, respiratory function stable and patient connected to nasal cannula oxygen Cardiovascular status: blood pressure returned to baseline and stable Postop Assessment: no apparent nausea or vomiting Anesthetic complications: no  No notable events documented.  Last Vitals:  Vitals:   04/10/22 1050 04/10/22 1100  BP: 134/74 (!) 144/80  Pulse: 80 77  Resp: 18 17  Temp:    SpO2: 96% 97%    Last Pain:  Vitals:   04/10/22 1100  TempSrc:   PainSc: 0-No pain                 Barnet Glasgow

## 2022-04-14 ENCOUNTER — Encounter (HOSPITAL_COMMUNITY): Payer: Self-pay | Admitting: Gastroenterology

## 2022-05-10 ENCOUNTER — Other Ambulatory Visit: Payer: Self-pay | Admitting: Nurse Practitioner

## 2022-05-10 DIAGNOSIS — R188 Other ascites: Secondary | ICD-10-CM

## 2022-05-10 DIAGNOSIS — K7469 Other cirrhosis of liver: Secondary | ICD-10-CM

## 2022-05-10 DIAGNOSIS — D376 Neoplasm of uncertain behavior of liver, gallbladder and bile ducts: Secondary | ICD-10-CM

## 2022-05-10 DIAGNOSIS — I81 Portal vein thrombosis: Secondary | ICD-10-CM

## 2022-05-10 DIAGNOSIS — I8500 Esophageal varices without bleeding: Secondary | ICD-10-CM

## 2022-05-12 ENCOUNTER — Other Ambulatory Visit: Payer: Self-pay

## 2022-05-12 DIAGNOSIS — K766 Portal hypertension: Secondary | ICD-10-CM

## 2022-06-04 NOTE — Progress Notes (Incomplete)
[place]  [address] [place],  [place]  [address] [phone]  Clinic Day:  12/02/2021  Referring physician: Emmaline Kluver, *  HISTORY OF PRESENT ILLNESS:  Colton Leon is a 57 y.o. 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  He is currently on diuretics to minimize the amount of ascites from his cirrhosis.  Since last visit also showed him to be more profoundly anemic, which was due to iron deficiency.  Based upon this, he was given a course of IV iron.  He comes in today to reassess his peripheral counts, particularly his hemoglobin, to see how well he responded to his recent IV iron.  Since his last visit, the patient has been doing okay.  He has been told he has a lesion in his liver that may signify the early stages of hepatocellular carcinoma.  He is somewhat frustrated as this issue has not been clarified over time.  This lesion was seen on his December 2022 MRI, but not mentioned on his March 2023 MRI.  VITALS:  There were no vitals taken for this visit.  Wt Readings from Last 3 Encounters:  04/10/22 155 lb (70.3 kg)  01/02/22 166 lb 10.7 oz (75.6 kg)  12/02/21 166 lb 11.2 oz (75.6 kg)    There is no height or weight on file to calculate BMI.  Performance status (ECOG): 1 - Symptomatic but completely ambulatory  PHYSICAL EXAM:  Physical Exam Constitutional:      General: He is not in acute distress.    Appearance: Normal appearance. He is normal weight. He is not ill-appearing, toxic-appearing or diaphoretic.  HENT:     Head: Normocephalic and atraumatic.     Right Ear: Tympanic membrane normal.     Left Ear: Tympanic membrane normal.     Nose: Nose normal. No congestion or rhinorrhea.     Mouth/Throat:     Mouth: Mucous membranes are moist.     Pharynx: Oropharynx is clear. No oropharyngeal exudate or posterior oropharyngeal  erythema.  Eyes:     General: No scleral icterus.       Right eye: No discharge.        Left eye: No discharge.     Extraocular Movements: Extraocular movements intact.     Conjunctiva/sclera: Conjunctivae normal.     Pupils: Pupils are equal, round, and reactive to light.  Neck:     Vascular: No carotid bruit.  Cardiovascular:     Rate and Rhythm: Normal rate and regular rhythm.     Heart sounds: No murmur heard.    No friction rub. No gallop.  Pulmonary:     Effort: Pulmonary effort is normal. No respiratory distress.     Breath sounds: Normal breath sounds. No stridor. No wheezing, rhonchi or rales.  Chest:     Chest wall: No tenderness.  Abdominal:     General: Abdomen is flat. Bowel sounds are normal. There is distension.     Palpations: There is splenomegaly. There is no mass.     Tenderness: There is no abdominal tenderness. There is no right CVA tenderness, left CVA tenderness, guarding or rebound.     Hernia: No hernia is present.  Musculoskeletal:        General: No swelling, tenderness, deformity or signs of injury. Normal range of motion.     Cervical back: Normal range of motion and neck  supple. No rigidity or tenderness.     Right lower leg: No edema.     Left lower leg: No edema.  Lymphadenopathy:     Cervical: No cervical adenopathy.  Skin:    General: Skin is warm and dry.     Capillary Refill: Capillary refill takes less than 2 seconds.     Coloration: Skin is not jaundiced or pale.     Findings: No bruising, erythema, lesion or rash.  Neurological:     General: No focal deficit present.     Mental Status: He is alert and oriented to person, place, and time. Mental status is at baseline.     Cranial Nerves: No cranial nerve deficit.     Sensory: No sensory deficit.     Motor: No weakness.     Coordination: Coordination normal.     Gait: Gait normal.     Deep Tendon Reflexes: Reflexes normal.  Psychiatric:        Mood and Affect: Mood normal.         Behavior: Behavior normal.        Thought Content: Thought content normal.        Judgment: Judgment normal.   SCANS: His abdominal MRI revealed the following: FINDINGS: Lower chest: Lung bases without signs of effusion or evidence of consolidative process.   Hepatobiliary: Area of subtle hypointensity on T1 anterior to the porta hepatis may display some mild steatotic changes. Arterial phase enhancement measuring 1.8 cm (image 24/6) fissural widening of the liver with LEFT lobe hypertrophy is similar to prior studies. Arterial phase is mildly degraded secondary to respiratory motion. No additional arterial enhancing foci are noted.   On equilibrium phase this area is nearly isointense to surrounding liver. Only minimally hypointense on venous phase. Area is nearly isointense on T2 weighted imaging and without signs of restricted diffusion. On hepatobiliary phase near uniform isointensity to adjacent liver with band of hypointensity tracking along the posterior margin of the pre portal liver (image 43/17) no masslike features exhibited on hepatobiliary phase.   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   Small cyst in the RIGHT hepatic lobe (image 5/13) this is stable compatible with a benign lesion.   No pericholecystic stranding or signs of biliary duct distension. Small amount of sludge in the dependent gallbladder   Pancreas: Normal intrinsic T1 signal. No ductal dilation or sign of inflammation. No focal lesion.   Spleen: Marked splenomegaly up to 23 cm greatest craniocaudal dimension.   Adrenals/Urinary Tract: RIGHT kidney is normal. Imaged portions the LEFT kidney are unremarkable. The kidney  displaced inferiorly by large spleen. Adrenal glands are normal.   Stomach/Bowel: Unremarkable to the extent evaluated on abdominal MRI.   Vascular/Lymphatic: Portosystemic collaterals and signs of splenic-portal venous thrombosis.   Other:  No ascites   Musculoskeletal: No suspicious bone lesions identified.   IMPRESSION: 1. Arterial phase enhancement in the pre portal central liver without masslike features on hepatic biliary phase with central bandlike area of hypointensity which may represent an area of fibrosis or small vessel along the margin of the liver. Findings are favored to represent transient intensity differences due to perfusional anomaly, categorized as LI-RADS category 3 given more focal appearance on the arterial phase. Three-month follow-up  is suggested to document stability with further imaging based on follow-up assessment. 2. Signs of portal venous hypertension with abundant varices in the stomach, distal esophagus and throughout the upper abdomen. Also associated with more marked splenomegaly. 3. 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 4. Small amount of sludge in the dependent gallbladder.    LABS:      Latest Ref Rng & Units 12/02/2021   12:00 AM 08/11/2021    7:38 AM 07/13/2021   12:00 AM  CBC  WBC  1.6     1.5 Repeated and verified X2.  1.6   Hemoglobin 13.5 - 17.5 13.4     11.7  9.1   Hematocrit 41 - 53 40     35.4  29   Platelets 150 - 400 K/uL 40     51.0  53      This result is from an external source.       Latest Ref Rng & Units 12/02/2021   12:00 AM 07/13/2021   12:00 AM  CMP  BUN 4 - '21 6     16   '$ Creatinine 0.6 - 1.3 0.5     0.6   Sodium 137 - 147 133     137   Potassium 3.5 - 5.1 mEq/L 4.2     4.3   Chloride 99 - 108 102     101   CO2 13 - '22 22     28    '$ Calcium 8.7 - 10.7 8.6     8.7   Alkaline Phos 25 - 125 89     80   AST 14 - 40 26     37   ALT 10 - 40 U/L 24     22      This result is from an external source.     Latest Reference Range & Units Most Recent 07/13/21 11:28  Iron 45 - 182 ug/dL 108 12/02/21 09:26 231 (H)  UIBC ug/dL 274 12/02/21 09:26 264  TIBC 250 - 450 ug/dL 382 12/02/21 09:26 495 (H)  Saturation Ratios 17.9 - 39.5 % 28 12/02/21 09:26 47 (H)  Ferritin 24 - 336 ng/mL 15 (L) 12/02/21 09:26 7 (L)  (H): Data is abnormally high (L): Data is abnormally low  ASSESSMENT & PLAN:  A 57 y.o. male whose pancytopenia appears to be secondary to his marked cirrhosis and secondary splenomegaly.  When evaluating his labs, I am pleased that his hemoglobin is better today.  This reflects the benefit CBC from his recent IV iron.  He understands pancytopenia will always remain an issue with his native cirrhotic liver and secondary splenomegaly.  However, he is relatively stable from a hematologic perspective.  Based upon this, I will see him back in 6 months for repeat clinical assessment.The patient understands all the plans discussed today and is in agreement with them.  Ikeisha Blumberg Macarthur Critchley, MD

## 2022-06-05 ENCOUNTER — Ambulatory Visit: Payer: Commercial Managed Care - HMO | Admitting: Oncology

## 2022-06-05 ENCOUNTER — Inpatient Hospital Stay: Payer: Commercial Managed Care - HMO

## 2022-06-08 ENCOUNTER — Telehealth: Payer: Self-pay

## 2022-06-08 ENCOUNTER — Other Ambulatory Visit: Payer: Self-pay

## 2022-06-08 DIAGNOSIS — I8500 Esophageal varices without bleeding: Secondary | ICD-10-CM

## 2022-06-08 DIAGNOSIS — R188 Other ascites: Secondary | ICD-10-CM

## 2022-06-08 NOTE — Telephone Encounter (Signed)
Pt scheduled for repeat EGD with banding at [facility] [address] 07/27/22 at 9:45am. Pt to arrive there at 8:15am. Instructions mailed and sent via mychart. [id number]. Amb ref in epic. Pt aware of appt.

## 2022-06-09 ENCOUNTER — Telehealth: Payer: Commercial Managed Care - HMO

## 2022-07-10 ENCOUNTER — Other Ambulatory Visit: Payer: Commercial Managed Care - HMO

## 2022-07-11 NOTE — Progress Notes (Unsigned)
[place]  [address] [place],  [place]  [address] [phone]  Clinic Day:  12/02/2021  Referring physician: Emmaline Kluver, *  HISTORY OF PRESENT ILLNESS:  Colton Leon is a 57 y.o. 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  He is currently on diuretics to minimize the amount of ascites from his cirrhosis.  Since last visit also showed him to be more profoundly anemic, which was due to iron deficiency.  Based upon this, he was given a course of IV iron.  He comes in today to reassess his peripheral counts, particularly his hemoglobin, to see how well he responded to his recent IV iron.  Since his last visit, the patient has been doing okay.  He has been told he has a lesion in his liver that may signify the early stages of hepatocellular carcinoma.  He is somewhat frustrated as this issue has not been clarified over time.  This lesion was seen on his December 2022 MRI, but not mentioned on his March 2023 MRI.  VITALS:  There were no vitals taken for this visit.  Wt Readings from Last 3 Encounters:  04/10/22 155 lb (70.3 kg)  01/02/22 166 lb 10.7 oz (75.6 kg)  12/02/21 166 lb 11.2 oz (75.6 kg)    There is no height or weight on file to calculate BMI.  Performance status (ECOG): 1 - Symptomatic but completely ambulatory  PHYSICAL EXAM:  Physical Exam Constitutional:      General: He is not in acute distress.    Appearance: Normal appearance. He is normal weight. He is not ill-appearing, toxic-appearing or diaphoretic.  HENT:     Head: Normocephalic and atraumatic.     Right Ear: Tympanic membrane normal.     Left Ear: Tympanic membrane normal.     Nose: Nose normal. No congestion or rhinorrhea.     Mouth/Throat:     Mouth: Mucous membranes are moist.     Pharynx: Oropharynx is clear. No oropharyngeal exudate or posterior oropharyngeal  erythema.  Eyes:     General: No scleral icterus.       Right eye: No discharge.        Left eye: No discharge.     Extraocular Movements: Extraocular movements intact.     Conjunctiva/sclera: Conjunctivae normal.     Pupils: Pupils are equal, round, and reactive to light.  Neck:     Vascular: No carotid bruit.  Cardiovascular:     Rate and Rhythm: Normal rate and regular rhythm.     Heart sounds: No murmur heard.    No friction rub. No gallop.  Pulmonary:     Effort: Pulmonary effort is normal. No respiratory distress.     Breath sounds: Normal breath sounds. No stridor. No wheezing, rhonchi or rales.  Chest:     Chest wall: No tenderness.  Abdominal:     General: Abdomen is flat. Bowel sounds are normal. There is distension.     Palpations: There is splenomegaly. There is no mass.     Tenderness: There is no abdominal tenderness. There is no right CVA tenderness, left CVA tenderness, guarding or rebound.     Hernia: No hernia is present.  Musculoskeletal:        General: No swelling, tenderness, deformity or signs of injury. Normal range of motion.     Cervical back: Normal range of motion and neck  supple. No rigidity or tenderness.     Right lower leg: No edema.     Left lower leg: No edema.  Lymphadenopathy:     Cervical: No cervical adenopathy.  Skin:    General: Skin is warm and dry.     Capillary Refill: Capillary refill takes less than 2 seconds.     Coloration: Skin is not jaundiced or pale.     Findings: No bruising, erythema, lesion or rash.  Neurological:     General: No focal deficit present.     Mental Status: He is alert and oriented to person, place, and time. Mental status is at baseline.     Cranial Nerves: No cranial nerve deficit.     Sensory: No sensory deficit.     Motor: No weakness.     Coordination: Coordination normal.     Gait: Gait normal.     Deep Tendon Reflexes: Reflexes normal.  Psychiatric:        Mood and Affect: Mood normal.         Behavior: Behavior normal.        Thought Content: Thought content normal.        Judgment: Judgment normal.    SCANS: His abdominal MRI revealed the following: FINDINGS: Lower chest: Lung bases without signs of effusion or evidence of consolidative process.   Hepatobiliary: Area of subtle hypointensity on T1 anterior to the porta hepatis may display some mild steatotic changes. Arterial phase enhancement measuring 1.8 cm (image 24/6) fissural widening of the liver with LEFT lobe hypertrophy is similar to prior studies. Arterial phase is mildly degraded secondary to respiratory motion. No additional arterial enhancing foci are noted.   On equilibrium phase this area is nearly isointense to surrounding liver. Only minimally hypointense on venous phase. Area is nearly isointense on T2 weighted imaging and without signs of restricted diffusion. On hepatobiliary phase near uniform isointensity to adjacent liver with band of hypointensity tracking along the posterior margin of the pre portal liver (image 43/17) no masslike features exhibited on hepatobiliary phase.   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   Small cyst in the RIGHT hepatic lobe (image 5/13) this is stable compatible with a benign lesion.   No pericholecystic stranding or signs of biliary duct distension. Small amount of sludge in the dependent gallbladder   Pancreas: Normal intrinsic T1 signal. No ductal dilation or sign of inflammation. No focal lesion.   Spleen: Marked splenomegaly up to 23 cm greatest craniocaudal dimension.   Adrenals/Urinary Tract: RIGHT kidney is normal. Imaged portions the LEFT kidney are unremarkable. The kidney  displaced inferiorly by large spleen. Adrenal glands are normal.   Stomach/Bowel: Unremarkable to the extent evaluated on abdominal MRI.   Vascular/Lymphatic: Portosystemic collaterals and signs of splenic-portal venous thrombosis.   Other:  No ascites   Musculoskeletal: No suspicious bone lesions identified.   IMPRESSION: 1. Arterial phase enhancement in the pre portal central liver without masslike features on hepatic biliary phase with central bandlike area of hypointensity which may represent an area of fibrosis or small vessel along the margin of the liver. Findings are favored to represent transient intensity differences due to perfusional anomaly, categorized as LI-RADS category 3 given more focal appearance on the arterial phase. Three-month  follow-up is suggested to document stability with further imaging based on follow-up assessment. 2. Signs of portal venous hypertension with abundant varices in the stomach, distal esophagus and throughout the upper abdomen. Also associated with more marked splenomegaly. 3. 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 4. Small amount of sludge in the dependent gallbladder.    LABS:      Latest Ref Rng & Units 12/02/2021   12:00 AM 08/11/2021    7:38 AM 07/13/2021   12:00 AM  CBC  WBC  1.6     1.5 Repeated and verified X2.  1.6   Hemoglobin 13.5 - 17.5 13.4     11.7  9.1   Hematocrit 41 - 53 40     35.4  29   Platelets 150 - 400 K/uL 40     51.0  53      This result is from an external source.       Latest Ref Rng & Units 12/02/2021   12:00 AM 07/13/2021   12:00 AM  CMP  BUN 4 - 21 6     16   $ Creatinine 0.6 - 1.3 0.5     0.6   Sodium 137 - 147 133     137   Potassium 3.5 - 5.1 mEq/L 4.2     4.3   Chloride 99 - 108 102     101   CO2 13 - 22 22     28    $ Calcium 8.7 - 10.7 8.6     8.7   Alkaline Phos 25 - 125 89     80   AST 14 - 40 26     37   ALT 10 - 40 U/L 24     22      This result is from an external source.     Latest Reference Range & Units Most Recent 07/13/21 11:28  Iron 45 - 182 ug/dL 108 12/02/21 09:26 231 (H)  UIBC ug/dL 274 12/02/21 09:26 264  TIBC 250 - 450 ug/dL 382 12/02/21 09:26 495 (H)  Saturation Ratios 17.9 - 39.5 % 28 12/02/21 09:26 47 (H)  Ferritin 24 - 336 ng/mL 15 (L) 12/02/21 09:26 7 (L)  (H): Data is abnormally high (L): Data is abnormally low  ASSESSMENT & PLAN:  A 57 y.o. male whose pancytopenia appears to be secondary to his marked cirrhosis and secondary splenomegaly.  When evaluating his labs, I am pleased that his hemoglobin is better today.  This reflects the benefit CBC from his recent IV iron.  He understands pancytopenia will always remain an issue with his native cirrhotic liver and secondary splenomegaly.  However, he is relatively stable from a hematologic perspective.  Based upon this, I will see him back in 6 months for repeat clinical assessment.  B.  The patient understands all the plans discussed today and is in agreement with them.  Nithya Meriweather Macarthur Critchley, MD

## 2022-07-12 ENCOUNTER — Other Ambulatory Visit: Payer: Self-pay | Admitting: Gastroenterology

## 2022-07-12 ENCOUNTER — Inpatient Hospital Stay: Payer: Commercial Managed Care - HMO | Admitting: Oncology

## 2022-07-12 ENCOUNTER — Inpatient Hospital Stay: Payer: Commercial Managed Care - HMO | Attending: Oncology

## 2022-07-12 ENCOUNTER — Telehealth: Payer: Self-pay | Admitting: Oncology

## 2022-07-12 ENCOUNTER — Other Ambulatory Visit: Payer: Self-pay | Admitting: Oncology

## 2022-07-12 VITALS — BP 148/74 | HR 91 | Temp 98.4°F | Resp 16 | Ht 66.0 in | Wt 168.5 lb

## 2022-07-12 DIAGNOSIS — D61818 Other pancytopenia: Secondary | ICD-10-CM | POA: Diagnosis present

## 2022-07-12 LAB — CMP (CANCER CENTER ONLY)
ALT: 25 U/L (ref 0–44)
AST: 23 U/L (ref 15–41)
Albumin: 4.1 g/dL (ref 3.5–5.0)
Alkaline Phosphatase: 79 U/L (ref 38–126)
Anion gap: 7 (ref 5–15)
BUN: 9 mg/dL (ref 6–20)
CO2: 26 mmol/L (ref 22–32)
Calcium: 8.9 mg/dL (ref 8.9–10.3)
Chloride: 102 mmol/L (ref 98–111)
Creatinine: 0.75 mg/dL (ref 0.61–1.24)
GFR, Estimated: >60 mL/min (ref >60)
Glucose, Bld: 376 mg/dL — ABNORMAL HIGH (ref 70–99)
Potassium: 3.8 mmol/L (ref 3.5–5.1)
Sodium: 135 mmol/L (ref 135–145)
Total Bilirubin: 1.7 mg/dL — ABNORMAL HIGH (ref 0.3–1.2)
Total Protein: 7.8 g/dL (ref 6.5–8.1)

## 2022-07-12 LAB — CBC: RBC: 4.58 (ref 3.87–5.11)

## 2022-07-12 LAB — CBC AND DIFFERENTIAL
HCT: 41 (ref 41–53)
Hemoglobin: 14 (ref 13.5–17.5)
Neutrophils Absolute: 2.5
Platelets: 40 10*3/uL — AB (ref 150–400)
WBC: 3.2

## 2022-07-12 LAB — IRON AND TIBC
Iron: 75 ug/dL (ref 45–182)
Saturation Ratios: 17 % — ABNORMAL LOW (ref 17.9–39.5)
TIBC: 442 ug/dL (ref 250–450)
UIBC: 367 ug/dL

## 2022-07-12 LAB — FERRITIN: Ferritin: 12 ng/mL — ABNORMAL LOW (ref 24–336)

## 2022-07-12 LAB — FOLATE: Folate: 8.7 ng/mL (ref >5.9)

## 2022-07-12 LAB — VITAMIN B12: Vitamin B-12: 507 pg/mL (ref 180–914)

## 2022-07-12 NOTE — Telephone Encounter (Signed)
07/12/22 Next appt scheduled and confirmed with patient

## 2022-07-20 ENCOUNTER — Encounter (HOSPITAL_COMMUNITY): Payer: Self-pay | Admitting: Gastroenterology

## 2022-07-20 NOTE — Progress Notes (Signed)
Attempted to obtain medical history via telephone, unable to reach at this time. HIPAA compliant voicemail message left requesting return call to pre surgical testing department. 

## 2022-07-27 ENCOUNTER — Encounter (HOSPITAL_COMMUNITY)
Admission: RE | Disposition: A | Discharge status: Home / Self Care | Payer: Self-pay | Source: Home / Self Care | Attending: Gastroenterology

## 2022-07-27 ENCOUNTER — Ambulatory Visit (HOSPITAL_BASED_OUTPATIENT_CLINIC_OR_DEPARTMENT_OTHER): Payer: Commercial Managed Care - HMO | Admitting: Anesthesiology

## 2022-07-27 ENCOUNTER — Ambulatory Visit (HOSPITAL_COMMUNITY)
Admission: RE | Admit: 2022-07-27 | Discharge: 2022-07-27 | Disposition: A | Discharge status: Home / Self Care | Payer: Commercial Managed Care - HMO | Attending: Gastroenterology | Admitting: Gastroenterology

## 2022-07-27 ENCOUNTER — Encounter (HOSPITAL_COMMUNITY): Payer: Self-pay | Admitting: Gastroenterology

## 2022-07-27 ENCOUNTER — Ambulatory Visit (HOSPITAL_COMMUNITY): Payer: Commercial Managed Care - HMO | Admitting: Anesthesiology

## 2022-07-27 ENCOUNTER — Other Ambulatory Visit: Payer: Self-pay

## 2022-07-27 DIAGNOSIS — K3189 Other diseases of stomach and duodenum: Secondary | ICD-10-CM | POA: Insufficient documentation

## 2022-07-27 DIAGNOSIS — D649 Anemia, unspecified: Secondary | ICD-10-CM

## 2022-07-27 DIAGNOSIS — I8510 Secondary esophageal varices without bleeding: Secondary | ICD-10-CM | POA: Diagnosis present

## 2022-07-27 DIAGNOSIS — K766 Portal hypertension: Secondary | ICD-10-CM

## 2022-07-27 DIAGNOSIS — F17210 Nicotine dependence, cigarettes, uncomplicated: Secondary | ICD-10-CM | POA: Insufficient documentation

## 2022-07-27 DIAGNOSIS — I8500 Esophageal varices without bleeding: Secondary | ICD-10-CM | POA: Diagnosis not present

## 2022-07-27 DIAGNOSIS — K7460 Unspecified cirrhosis of liver: Secondary | ICD-10-CM

## 2022-07-27 HISTORY — PX: ESOPHAGEAL BANDING: SHX5518

## 2022-07-27 HISTORY — PX: ESOPHAGOGASTRODUODENOSCOPY (EGD) WITH PROPOFOL: SHX5813

## 2022-07-27 LAB — GLUCOSE, CAPILLARY: Glucose-Capillary: 266 mg/dL — ABNORMAL HIGH (ref 70–99)

## 2022-07-27 SURGERY — ESOPHAGOGASTRODUODENOSCOPY (EGD) WITH PROPOFOL
Anesthesia: Monitor Anesthesia Care

## 2022-07-27 MED ORDER — SODIUM CHLORIDE 0.9 % IV SOLN: INTRAVENOUS | Status: DC

## 2022-07-27 MED ORDER — LIDOCAINE 2% (20 MG/ML) 5 ML SYRINGE
INTRAMUSCULAR | Status: DC | PRN
  Administered 2022-07-27: 100 mg via INTRAVENOUS

## 2022-07-27 MED ORDER — PANTOPRAZOLE SODIUM 40 MG PO TBEC: 40.0000 mg | DELAYED_RELEASE_TABLET | Freq: Two times a day (BID) | ORAL | 1 refills | Status: DC

## 2022-07-27 MED ORDER — LACTATED RINGERS IV SOLN: INTRAVENOUS | Status: DC

## 2022-07-27 MED ORDER — PROPOFOL 10 MG/ML IV BOLUS
INTRAVENOUS | Status: DC | PRN
  Administered 2022-07-27 (×3): 20 mg via INTRAVENOUS

## 2022-07-27 MED ORDER — PROPOFOL 500 MG/50ML IV EMUL
INTRAVENOUS | Status: DC | PRN
  Administered 2022-07-27: 150 ug/kg/min via INTRAVENOUS

## 2022-07-27 SURGICAL SUPPLY — 15 items

## 2022-07-27 NOTE — Transfer of Care (Signed)
Immediate Anesthesia Transfer of Care Note  Patient: Colton Leon  Procedure(s) Performed: EGD ESOPHAGEAL BANDING  Patient Location: PACU  Anesthesia Type:MAC  Level of Consciousness: sedated  Airway & Oxygen Therapy: Patient Spontanous Breathing and Patient connected to face mask oxygen  Post-op Assessment: Report given to RN and Post -op Vital signs reviewed and stable  Post vital signs: Reviewed and stable  Last Vitals:  Vitals Value Taken Time  BP 176/90 07/27/22 1038  Temp    Pulse 94 07/27/22 1040  Resp 22 07/27/22 1040  SpO2 100 % 07/27/22 1040  Vitals shown include unvalidated device data.  Last Pain:  Vitals:   07/27/22 0755  TempSrc: Temporal  PainSc: 0-No pain         Complications: No notable events documented.

## 2022-07-27 NOTE — H&P (Signed)
[facility] History and Physical   Primary Care Physician:  Colton Leon, Colton Mt, MD   Reason for Procedure:   Treatment of esophageal varices  Plan:    EGD with variceal band ligation     HPI: Colton Leon is a 57 y.o. male undergoing repeat EGD with variceal banding.  His EGD was Nov 20 and four bands were placed.  This is his fifth banding session.  He has chronic thombocytopenia, with most recent plt 40.   Past Medical History:  Diagnosis Date   ADHD    Anxiety and depression    Blood in stool    hx of   Cirrhosis ([facility])    Depression    Headache    Hematuria    hx of   Hernia of abdominal cavity    History of kidney stones    Hyperlipidemia    Neoplasm of uncertain behavior of liver    Neoplasm of uncertain behavior of liver    Pancytopenia (HCC)    iron   Peptic ulcer    Pneumonia    Portal hypertension (HCC)    Portal vein thrombosis    Pre-diabetes    Shortness of breath on exertion    Symptom of blood in vomit    hx of   Varices, esophageal ([facility]ton Leon)     Past Surgical History:  Procedure Laterality Date   BIOPSY  01/02/2022   Procedure: BIOPSY;  Surgeon: [name], MD;  Location: [facility];  Service: Gastroenterology;;   CIRCUMCISION     dental surgeries     ESOPHAGEAL BANDING N/A 08/16/2021   Procedure: ESOPHAGEAL BANDING;  Surgeon: Doran Stabler, MD;  Location: [facility];  Service: Gastroenterology;  Laterality: N/A;   ESOPHAGEAL BANDING  11/21/2021   Procedure: ESOPHAGEAL BANDING;  Surgeon: [name], MD;  Location: [facility];  Service: Gastroenterology;;   ESOPHAGEAL BANDING  01/02/2022   Procedure: ESOPHAGEAL BANDING;  Surgeon: [name], MD;  Location: [facility];  Service: Gastroenterology;;   ESOPHAGEAL BANDING  04/10/2022   Procedure: ESOPHAGEAL BANDING;  Surgeon: [name], MD;  Location: [facility];  Service: Gastroenterology;;   ESOPHAGOGASTRODUODENOSCOPY (EGD) WITH  PROPOFOL N/A 08/16/2021   Procedure: ESOPHAGOGASTRODUODENOSCOPY (EGD) WITH PROPOFOL;  Surgeon: Doran Stabler, MD;  Location: [facility];  Service: Gastroenterology;  Laterality: N/A;   ESOPHAGOGASTRODUODENOSCOPY (EGD) WITH PROPOFOL N/A 11/21/2021   Procedure: ESOPHAGOGASTRODUODENOSCOPY (EGD) WITH PROPOFOL;  Surgeon: [name], MD;  Location: [facility];  Service: Gastroenterology;  Laterality: N/A;   ESOPHAGOGASTRODUODENOSCOPY (EGD) WITH PROPOFOL N/A 01/02/2022   Procedure: ESOPHAGOGASTRODUODENOSCOPY (EGD) WITH PROPOFOL;  Surgeon: [name], MD;  Location: [facility];  Service: Gastroenterology;  Laterality: N/A;   ESOPHAGOGASTRODUODENOSCOPY (EGD) WITH PROPOFOL N/A 04/10/2022   Procedure: ESOPHAGOGASTRODUODENOSCOPY (EGD) WITH PROPOFOL;  Surgeon: [name], MD;  Location: [facility];  Service: Gastroenterology;  Laterality: N/A;   TONSILLECTOMY     URETHRAL STRICTURE DILATATION      Prior to Admission medications   Medication Sig Start Date End Date Taking? Authorizing Provider  buPROPion (WELLBUTRIN SR) 150 MG 12 hr tablet Take 150 mg by mouth daily. In the afternoon 10/07/21  Yes [provider]  furosemide (LASIX) 20 MG tablet Take 40-60 mg by mouth See admin instructions. 40 mg in the morning, 60 mg in the afternoon (1700) 06/01/21  Yes [provider]  pantoprazole (PROTONIX) 40 MG tablet Take 40 mg by mouth daily.   Yes [provider]  spironolactone (ALDACTONE) 50 MG tablet Take 50 mg by mouth 2 (two) times daily. 06/01/21  Yes [provider]  acetaminophen (TYLENOL) 500 MG tablet Take 500 mg by mouth every 6 (six) hours as needed for moderate pain.    [provider]    Current Facility-Administered Medications  Medication Dose Route Frequency Provider Last Rate Last Admin   0.9 %  sodium chloride infusion   Intravenous Continuous [name], MD       lactated ringers infusion   Intravenous  Continuous [name], MD 10 mL/hr at 07/27/22 1007 Continued from Pre-op at 07/27/22 1007   Facility-Administered Medications Ordered in Other Encounters  Medication Dose Route Frequency Provider Last Rate Last Admin   lidocaine 2% (20 mg/mL) 5 mL syringe   Intravenous Anesthesia Intra-op Lind Covert, CRNA   100 mg at 07/27/22 1008   propofol (DIPRIVAN) 10 mg/mL bolus/IV push   Intravenous Anesthesia Intra-op Lind Covert, CRNA   20 mg at 07/27/22 1011   propofol (DIPRIVAN) 500 MG/50ML infusion   Intravenous Continuous PRN Lind Covert, CRNA 63.27 mL/hr at 07/27/22 1009 150 mcg/kg/min at 07/27/22 1009    Allergies as of 06/08/2022 - Review Complete 04/10/2022  Allergen Reaction Noted   Augmentin [amoxicillin-pot clavulanate] Shortness Of Breath 06/01/2021   Shellfish allergy Shortness Of Breath 06/01/2021    Family History  Problem Relation Age of Onset   Hypertension Mother    Skin cancer Mother    Skin cancer Father    Emphysema Father    Bipolar disorder Father    Heart disease Father    Diabetes Sister    Hypertension Sister    Heart disease Maternal Grandmother    Skin cancer Maternal Grandfather    Heart disease Paternal Grandmother    Skin cancer Paternal Grandfather    Heart disease Paternal Grandfather    Autism spectrum disorder Daughter    Diabetes Nephew    Colon cancer Neg Hx     Social History   Socioeconomic History   Marital status: Divorced    Spouse name: Not on file   Number of children: 2   Years of education: 10   Highest education level: GED or equivalent  Occupational History   Occupation: Transport planner  Tobacco Use   Smoking status: Every Day    Packs/day: 1.00    Years: 38.00    Total pack years: 38.00    Types: Cigarettes   Smokeless tobacco: Never  Vaping Use   Vaping Use: Never used  Substance and Sexual Activity   Alcohol use: Never   Drug use: Never   Sexual activity: Not Currently  Other Topics Concern    Not on file  Social History Narrative   Not on file   Social Determinants of Health   Financial Resource Strain: Not on file  Food Insecurity: Not on file  Transportation Needs: Not on file  Physical Activity: Not on file  Stress: Not on file  Social Connections: Not on file  Intimate Partner Violence: Not on file    Review of Systems:  All other review of systems negative except as mentioned in the HPI.  Physical Exam: Vital signs BP 134/64   Pulse 82   Temp 98 F (36.7 C) (Temporal)   Resp 18   Ht '5\' 6"'$  (1.676 m)   Wt 70.3 kg   SpO2 99%   BMI 25.02 kg/m   General:   Alert,  Well-developed, well-nourished, pleasant and cooperative  in NAD Airway:  Mallampati 2 Lungs:  Clear throughout to auscultation.   Heart:  Regular rate and rhythm; no murmurs, clicks, rubs,  or gallops. Abdomen:  Soft, nontender and nondistended. Normal bowel sounds.   Neuro/Psych:  Normal mood and affect. A and O x 3   [facility] Leon E. Candis Schatz, MD [facility]

## 2022-07-27 NOTE — Anesthesia Postprocedure Evaluation (Signed)
Anesthesia Post Note  Patient: Colton Leon  Procedure(s) Performed: EGD ESOPHAGEAL BANDING     Patient location during evaluation: PACU Anesthesia Type: MAC Level of consciousness: awake and alert Pain management: pain level controlled Vital Signs Assessment: post-procedure vital signs reviewed and stable Respiratory status: spontaneous breathing, nonlabored ventilation, respiratory function stable and patient connected to nasal cannula oxygen Cardiovascular status: stable and blood pressure returned to baseline Postop Assessment: no apparent nausea or vomiting Anesthetic complications: no  No notable events documented.  Last Vitals:  Vitals:   07/27/22 0755 07/27/22 1039  BP: 134/64 (!) 176/90  Pulse: 82 92  Resp: 18 20  Temp: 36.7 C 36.7 C  SpO2: 99% 99%    Last Pain:  Vitals:   07/27/22 1039  TempSrc: Tympanic  PainSc: Asleep                 Holbert Caples S

## 2022-07-27 NOTE — Anesthesia Preprocedure Evaluation (Signed)
Anesthesia Evaluation  Patient identified by MRN, date of birth, ID band Patient awake    Reviewed: Allergy & Precautions, H&P , NPO status , Patient's Chart, lab work & pertinent test results  Airway Mallampati: II  TM Distance: >3 FB Neck ROM: Full    Dental no notable dental hx.    Pulmonary Current Smoker and Patient abstained from smoking.   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   Anesthesia Other Findings   Reproductive/Obstetrics negative OB ROS                             Anesthesia Physical Anesthesia Plan  ASA: 3  Anesthesia Plan: MAC   Post-op Pain Management: Minimal or no pain anticipated   Induction: Intravenous  PONV Risk Score and Plan: 1 and Propofol infusion and Treatment may vary due to age or medical condition  Airway Management Planned: Simple Face Mask  Additional Equipment:   Intra-op Plan:   Post-operative Plan:   Informed Consent: I have reviewed the patients History and Physical, chart, labs and discussed the procedure including the risks, benefits and alternatives for the proposed anesthesia with the patient or authorized representative who has indicated his/her understanding and acceptance.     Dental advisory given  Plan Discussed with: CRNA and Surgeon  Anesthesia Plan Comments:        Anesthesia Quick  Evaluation

## 2022-07-27 NOTE — Op Note (Signed)
[facility] Patient Name: Colton Leon Procedure Date: 07/27/2022 MRN: [id number] Attending MD: Gladstone Pih. Candis Schatz , MD, [id number] Date of Birth: 1965-11-26 CSN: [id number] Age: 57 Admit Type: Outpatient Procedure:                Upper GI endoscopy Indications:              For therapy of esophageal varices Providers:                Nicki Reaper E. Candis Schatz, MD, Fanny Skates RN, RN,                            Luan Moore, Technician, Rockwood Alday CRNA,                            CRNA Referring MD:             Gladstone Pih. Candis Schatz, MD Medicines:                Monitored Anesthesia Care Complications:            No immediate complications. Estimated Blood Loss:     Estimated blood loss was minimal. Procedure:                Pre-Anesthesia Assessment:                           - Prior to the procedure, a History and Physical                            was performed, and patient medications and                            allergies were reviewed. The patient's tolerance of                            previous anesthesia was also reviewed. The risks                            and benefits of the procedure and the sedation                            options and risks were discussed with the patient.                            All questions were answered, and informed consent                            was obtained. Prior Anticoagulants: The patient has                            taken no anticoagulant or antiplatelet agents. ASA                            Grade Assessment: III - A patient with severe  systemic disease. After reviewing the risks and                            benefits, the patient was deemed in satisfactory                            condition to undergo the procedure.                           After obtaining informed consent, the endoscope was                            passed under direct vision. Throughout the                             procedure, the patient's blood pressure, pulse, and                            oxygen saturations were monitored continuously. The                            [id number] Olympus endoscope was introduced                            through the mouth, and advanced to the second part                            of duodenum. The upper GI endoscopy was                            accomplished without difficulty. The patient                            tolerated the procedure well. Scope In: Scope Out: Findings:      Grade III varices were found in the middle third of the esophagus and in       the lower third of the esophagus. They were large in size. Six bands       were successfully placed with incomplete eradication of varices. There       was no bleeding at the end of the procedure.      The exam of the esophagus was otherwise normal.      Mild portal hypertensive gastropathy was found in the gastric fundus and       in the gastric body.      The exam of the stomach was otherwise normal.      The examined duodenum was normal. Impression:               - Grade III esophageal varices. Incompletely                            eradicated. Banded.                           - Portal hypertensive gastropathy.                           -  Normal examined duodenum.                           - No specimens collected. Moderate Sedation:      Not Applicable - Patient had care per Anesthesia. Recommendation:           - Patient has a contact number available for                            emergencies. The signs and symptoms of potential                            delayed complications were discussed with the                            patient. Return to normal activities tomorrow.                            Written discharge instructions were provided to the                            patient.                           - Full liquid diet today, then soft diet for 3-4                             days.                           - Continue present medications.                           - Repeat upper endoscopy (date not yet determined)                            for endoscopic band ligation.                           - Recommend consideration for TIPS given continued                            large varices despite multiple banding sessions                           - If not found to be TIPS candidate, resume band                            ligation                           - Use Prilosec (omeprazole) 20 mg PO BID for 8                            weeks. Procedure Code(s):        --- Professional ---  43244, Esophagogastroduodenoscopy, flexible,                            transoral; with band ligation of esophageal/gastric                            varices Diagnosis Code(s):        --- Professional ---                           I85.00, Esophageal varices without bleeding                           K76.6, Portal hypertension                           K31.89, Other diseases of stomach and duodenum CPT copyright 2022 American Medical Association. All rights reserved. The codes documented in this report are preliminary and upon coder review may  be revised to meet current compliance requirements. Nami Strawder E. Candis Schatz, MD 07/27/2022 10:49:17 AM This report has been signed electronically. Number of Addenda: 0

## 2022-07-27 NOTE — Discharge Instructions (Signed)
YOU HAD AN ENDOSCOPIC PROCEDURE TODAY: Refer to the procedure report and other information in the discharge instructions given to you for any specific questions about what was found during the examination. If this information does not answer your questions, please call [facility] office at [phone] to clarify.  ° °YOU SHOULD EXPECT: Some feelings of bloating in the abdomen. Passage of more gas than usual. Walking can help get rid of the air that was put into your GI tract during the procedure and reduce the bloating. If you had a lower endoscopy (such as a colonoscopy or flexible sigmoidoscopy) you may notice spotting of blood in your stool or on the toilet paper. Some abdominal soreness may be present for a day or two, also. ° °DIET: Your first meal following the procedure should be a light meal and then it is ok to progress to your normal diet. A half-sandwich or bowl of soup is an example of a good first meal. Heavy or fried foods are harder to digest and may make you feel nauseous or bloated. Drink plenty of fluids but you should avoid alcoholic beverages for 24 hours. If you had a esophageal dilation, please see attached instructions for diet.   ° °ACTIVITY: Your care partner should take you home directly after the procedure. You should plan to take it easy, moving slowly for the rest of the day. You can resume normal activity the day after the procedure however YOU SHOULD NOT DRIVE, use power tools, machinery or perform tasks that involve climbing or major physical exertion for 24 hours (because of the sedation medicines used during the test).  ° °SYMPTOMS TO REPORT IMMEDIATELY: °A gastroenterologist can be reached at any hour. Please call [phone]  for any of the following symptoms:  °Following lower endoscopy (colonoscopy, flexible sigmoidoscopy) °Excessive amounts of blood in the stool  °Significant tenderness, worsening of abdominal pains  °Swelling of the abdomen that is new, acute  °Fever of 100° or  higher  °Following upper endoscopy (EGD, EUS, ERCP, esophageal dilation) °Vomiting of blood or coffee ground material  °New, significant abdominal pain  °New, significant chest pain or pain under the shoulder blades  °Painful or persistently difficult swallowing  °New shortness of breath  °Black, tarry-looking or red, bloody stools ° °FOLLOW UP:  °If any biopsies were taken you will be contacted by phone or by letter within the next 1-3 weeks. Call [phone]  if you have not heard about the biopsies in 3 weeks.  °Please also call with any specific questions about appointments or follow up tests. ° °

## 2022-07-30 ENCOUNTER — Encounter (HOSPITAL_COMMUNITY): Payer: Self-pay | Admitting: Gastroenterology

## 2022-10-02 ENCOUNTER — Other Ambulatory Visit: Payer: Self-pay | Admitting: Gastroenterology

## 2022-11-09 ENCOUNTER — Other Ambulatory Visit: Payer: Self-pay | Admitting: Nurse Practitioner

## 2022-11-09 DIAGNOSIS — D376 Neoplasm of uncertain behavior of liver, gallbladder and bile ducts: Secondary | ICD-10-CM

## 2022-11-09 DIAGNOSIS — I8500 Esophageal varices without bleeding: Secondary | ICD-10-CM

## 2022-11-09 DIAGNOSIS — R188 Other ascites: Secondary | ICD-10-CM

## 2022-11-09 DIAGNOSIS — K7469 Other cirrhosis of liver: Secondary | ICD-10-CM

## 2022-11-14 ENCOUNTER — Other Ambulatory Visit: Payer: Self-pay | Admitting: Gastroenterology

## 2022-12-12 ENCOUNTER — Other Ambulatory Visit: Payer: Self-pay | Admitting: Gastroenterology

## 2022-12-21 ENCOUNTER — Ambulatory Visit
Admission: RE | Admit: 2022-12-21 | Discharge: 2022-12-21 | Disposition: A | Discharge status: Home / Self Care | Payer: Commercial Managed Care - HMO | Source: Ambulatory Visit | Attending: Nurse Practitioner | Admitting: Nurse Practitioner

## 2022-12-21 DIAGNOSIS — I8500 Esophageal varices without bleeding: Secondary | ICD-10-CM

## 2022-12-21 DIAGNOSIS — K7469 Other cirrhosis of liver: Secondary | ICD-10-CM

## 2022-12-21 DIAGNOSIS — D376 Neoplasm of uncertain behavior of liver, gallbladder and bile ducts: Secondary | ICD-10-CM

## 2022-12-21 DIAGNOSIS — R188 Other ascites: Secondary | ICD-10-CM

## 2022-12-21 MED ORDER — IOPAMIDOL (ISOVUE-300) INJECTION 61%
100.0000 mL | Freq: Once | INTRAVENOUS | Status: AC | PRN
  Administered 2022-12-21: 100 mL via INTRAVENOUS

## 2023-01-11 NOTE — Progress Notes (Deleted)
[facility]  [address] [place],  [place]  [address] [phone]  Clinic Day:  07/12/2022  Referring physician: Bobbye Morton, *  HISTORY OF PRESENT ILLNESS:  Colton Leon is a 57 y.o. 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   In the past, IV iron was effective in improving his iron stores and his hemoglobin.  He comes in today to reassess his peripheral counts.  Since his last visit, the patient has been doing okay.  Despite his low peripheral counts, the patient denies having any bleeding issues from his underlying thrombocytopenia or spontaneous infections from his underlying leukopenia.   VITALS:  There were no vitals taken for this visit.  Wt Readings from Last 3 Encounters:  07/27/22 155 lb (70.3 kg)  07/12/22 168 lb 8 oz (76.4 kg)  04/10/22 155 lb (70.3 kg)    There is no height or weight on file to calculate BMI.  Performance status (ECOG): 1 - Symptomatic but completely ambulatory  PHYSICAL EXAM:  Physical Exam Constitutional:      General: He is not in acute distress.    Appearance: Normal appearance. He is normal weight. He is not ill-appearing, toxic-appearing or diaphoretic.  HENT:     Head: Normocephalic and atraumatic.     Right Ear: Tympanic membrane normal.     Left Ear: Tympanic membrane normal.     Nose: Nose normal. No congestion or rhinorrhea.     Mouth/Throat:     Mouth: Mucous membranes are moist.     Pharynx: Oropharynx is clear. No oropharyngeal exudate or posterior oropharyngeal erythema.  Eyes:     General: No scleral icterus.       Right eye: No discharge.        Left eye: No discharge.     Extraocular Movements: Extraocular movements intact.     Conjunctiva/sclera: Conjunctivae normal.     Pupils: Pupils are equal, round, and reactive to light.  Neck:     Vascular: No carotid bruit.  Cardiovascular:     Rate and Rhythm: Normal rate and regular rhythm.     Heart  sounds: No murmur heard.    No friction rub. No gallop.  Pulmonary:     Effort: Pulmonary effort is normal. No respiratory distress.     Breath sounds: Normal breath sounds. No stridor. No wheezing, rhonchi or rales.  Chest:     Chest wall: No tenderness.  Abdominal:     General: Abdomen is flat. Bowel sounds are normal. There is distension.     Palpations: There is splenomegaly. There is no mass.     Tenderness: There is no abdominal tenderness. There is no right CVA tenderness, left CVA tenderness, guarding or rebound.     Hernia: No hernia is present.  Musculoskeletal:        General: No swelling, tenderness, deformity or signs of injury. Normal range of motion.     Cervical back: Normal range of motion and neck supple. No rigidity or tenderness.     Right lower leg: No edema.     Left lower leg: No edema.  Lymphadenopathy:     Cervical: No cervical adenopathy.  Skin:    General: Skin is warm and dry.     Capillary Refill: Capillary refill takes less than 2 seconds.     Coloration: Skin is not jaundiced or pale.     Findings: No bruising, erythema, lesion or rash.  Neurological:  General: No focal deficit present.     Mental Status: He is alert and oriented to person, place, and time. Mental status is at baseline.     Cranial Nerves: No cranial nerve deficit.     Sensory: No sensory deficit.     Motor: No weakness.     Coordination: Coordination normal.     Gait: Gait normal.     Deep Tendon Reflexes: Reflexes normal.  Psychiatric:        Mood and Affect: Mood normal.        Behavior: Behavior normal.        Thought Content: Thought content normal.        Judgment: Judgment normal.    LABS:      Latest Ref Rng & Units 07/12/2022   12:00 AM 12/02/2021   12:00 AM 08/11/2021    7:38 AM  CBC  WBC  3.2     1.6     1.5 Repeated and verified X2.   Hemoglobin 13.5 - 17.5 14.0     13.4     11.7   Hematocrit 41 - 53 41     40     35.4   Platelets 150 - 400 K/uL 40     40      51.0      This result is from an external source.      Latest Ref Rng & Units 07/12/2022    8:31 AM 12/02/2021   12:00 AM 07/13/2021   12:00 AM  CMP  Glucose 70 - 99 mg/dL 284     BUN 6 - 20 mg/dL 9  6     16    Creatinine 0.61 - 1.24 mg/dL 1.32  0.5     0.6   Sodium 135 - 145 mmol/L 135  133     137   Potassium 3.5 - 5.1 mmol/L 3.8  4.2     4.3   Chloride 98 - 111 mmol/L 102  102     101   CO2 22 - 32 mmol/L 26  22     28    Calcium 8.9 - 10.3 mg/dL 8.9  8.6     8.7   Total Protein 6.5 - 8.1 g/dL 7.8     Total Bilirubin 0.3 - 1.2 mg/dL 1.7     Alkaline Phos 38 - 126 U/L 79  89     80   AST 15 - 41 U/L 23  26     37   ALT 0 - 44 U/L 25  24     22       This result is from an external source.    Latest Reference Range & Units Most Recent 07/13/21 11:28  Iron 45 - 182 ug/dL 440 05/23/70 53:66 440 (H)  UIBC ug/dL 347 09/13/93 63:87 564  TIBC 250 - 450 ug/dL 332 9/51/88 41:66 063 (H)  Saturation Ratios 17.9 - 39.5 % 28 12/02/21 09:26 47 (H)  Ferritin 24 - 336 ng/mL 15 (L) 12/02/21 09:26 7 (L)  (H): Data is abnormally high (L): Data is abnormally low  ASSESSMENT & PLAN:  A 57 y.o. male with pancytopenia secondary to his marked cirrhosis and secondary splenomegaly.  When evaluating his labs, I am pleased as his hemoglobin is even better today than what it was previously.  Although low, his platelets are stable.  His white count is much better than what it has been previously.  From a hematologic standpoint, the patient is  doing fine.  I will see him back in 6 months for repeat clinical assessment. The patient understands all the plans discussed today and is in agreement with them.  Jorey Dollard Kirby Funk, MD

## 2023-01-12 ENCOUNTER — Inpatient Hospital Stay: Payer: Commercial Managed Care - HMO | Admitting: Oncology

## 2023-01-12 ENCOUNTER — Inpatient Hospital Stay: Payer: Commercial Managed Care - HMO

## 2023-01-22 ENCOUNTER — Other Ambulatory Visit: Payer: Self-pay | Admitting: Gastroenterology

## 2023-02-22 ENCOUNTER — Other Ambulatory Visit: Payer: Self-pay | Admitting: Gastroenterology

## 2023-02-23 ENCOUNTER — Other Ambulatory Visit: Payer: Self-pay | Admitting: Gastroenterology

## 2023-03-20 ENCOUNTER — Other Ambulatory Visit (HOSPITAL_COMMUNITY): Payer: Self-pay

## 2023-03-20 ENCOUNTER — Telehealth: Payer: Self-pay | Admitting: Pharmacy Technician

## 2023-03-20 NOTE — Telephone Encounter (Signed)
Pharmacy Patient Advocate Encounter   Received notification from CoverMyMeds that prior authorization for PANTOPRAZOLE 40MG  is required/requested.   Insurance verification completed.   The patient is insured through [employer] .   Per test claim: PA required; PA submitted to [employer] via CoverMyMeds Key/confirmation #/EOC [facility] Status is pending

## 2023-07-05 ENCOUNTER — Other Ambulatory Visit: Payer: Self-pay

## 2023-07-05 ENCOUNTER — Telehealth: Payer: Self-pay

## 2023-07-05 DIAGNOSIS — I8500 Esophageal varices without bleeding: Secondary | ICD-10-CM

## 2023-07-05 DIAGNOSIS — K7460 Unspecified cirrhosis of liver: Secondary | ICD-10-CM

## 2023-07-05 DIAGNOSIS — K7581 Nonalcoholic steatohepatitis (NASH): Secondary | ICD-10-CM

## 2023-07-05 NOTE — Telephone Encounter (Signed)
Contacted patient and scheduled him for and EGD at [facility] on 08/13/23 @ 10:30am.

## 2023-08-06 ENCOUNTER — Telehealth: Payer: Self-pay

## 2023-08-06 ENCOUNTER — Encounter (HOSPITAL_COMMUNITY): Payer: Self-pay | Admitting: Gastroenterology

## 2023-08-06 NOTE — Telephone Encounter (Signed)
 Patient was scheduled for a upper endoscopy at [facility] on 08/13/23. I contacted patient and his significant other Charlene and explained that his new insurance plan is non- participating with [facility] and that he would need to pay the full price of the procedure up front if they choose to proceed. He and Charlene were not aware that the plan they changed to was so restrictive when they signed up.  They asked that I cancel his upcoming procedure. He was provided the number to [facility] to establish GI care with an in network provider.   Case was cancelled with [facility].  There is a procedure availability on 08/13/23 at [facility] for Dr. Tomasa Rand.

## 2023-08-06 NOTE — Telephone Encounter (Signed)
 Noted.  Called patient on hospital wait list

## 2023-08-07 ENCOUNTER — Telehealth: Payer: Self-pay

## 2023-08-07 NOTE — Telephone Encounter (Signed)
 Procedure:endo Procedure date: 08/13/23  Procedure location: [facility] Arrival Time: 8am Spoke with the patient Y/N: y Any prep concerns? n  Has the patient obtained the prep from the pharmacy ? n Do you have a care partner and transportation: n Any additional concerns?   Pt states his appointment was cancel yesterday due to his insurance.

## 2023-08-13 ENCOUNTER — Ambulatory Visit (HOSPITAL_COMMUNITY): Admit: 2023-08-13 | Payer: Commercial Managed Care - HMO | Admitting: Gastroenterology

## 2023-08-13 ENCOUNTER — Encounter (HOSPITAL_COMMUNITY): Payer: Self-pay

## 2023-08-13 SURGERY — ESOPHAGOGASTRODUODENOSCOPY (EGD) WITH PROPOFOL
Anesthesia: Monitor Anesthesia Care

## 2023-12-17 IMAGING — MR MR ABDOMEN WO/W CM
16 series · 48 of 48 positions shown · IV contrast (EOVIST)
Comparison: Examination from May 20, 2021.

CLINICAL DATA: History of hepatic neoplasm.

EXAM:
MRI ABDOMEN WITHOUT AND WITH CONTRAST
TECHNIQUE: Multiplanar multisequence MR imaging of the abdomen was performed
both before and after the administration of intravenous contrast.
CONTRAST:  8mL EOVIST GADOXETATE DISODIUM 0.25 MOL/L IV SOLN

[Series 3: T2 · coronal · 5.0mm · 1.56mm/px · 2 of 36 slices shown (1 of 3)]
[im 1/36]
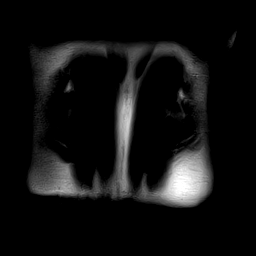
[im 36/36]
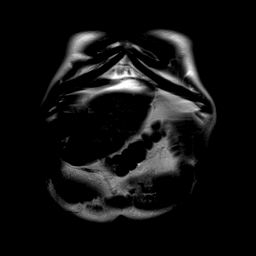

[Series 4: T1 · axial · 3.0mm · 1.19mm/px · z∈[-183,+54]mm · 7 of 160 slices shown]
[im 1/160]
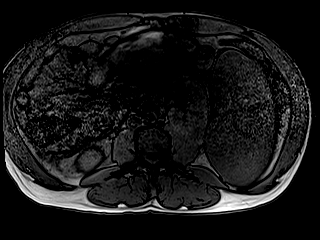
[im 27/160]
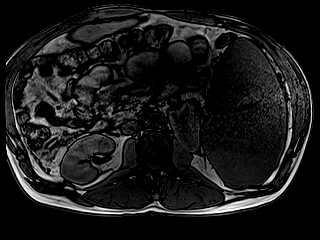
[im 54/160]
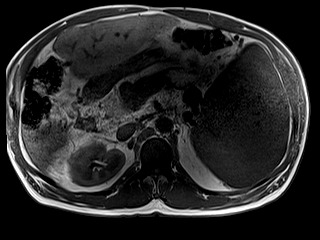
[im 80/160]
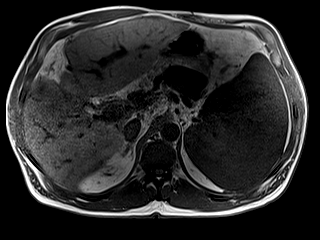
[im 107/160]
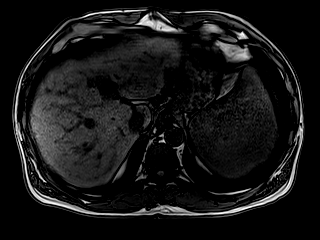
[im 133/160]
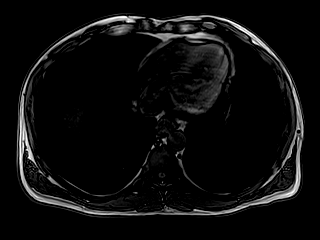
[im 160/160]
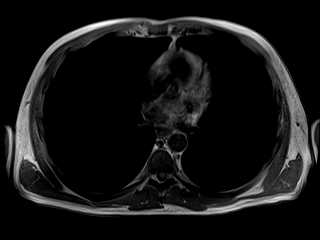

[Series 5: T1 dynamic · axial · non-contrast · 3.0mm · 1.19mm/px · z∈[-183,+54]mm · 4 of 80 slices shown]
[im 1/80]
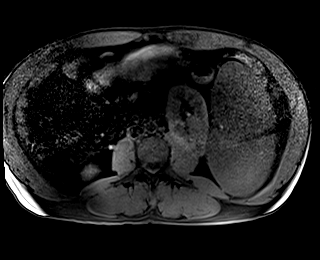
[im 27/80]
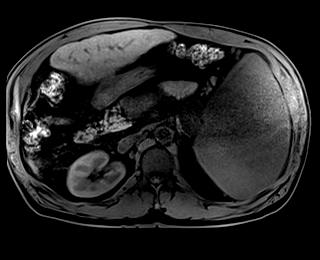
[im 53/80]
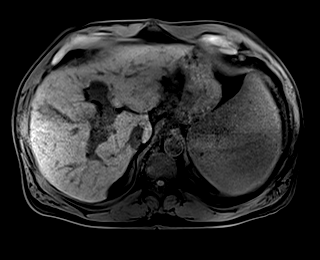
[im 80/80]
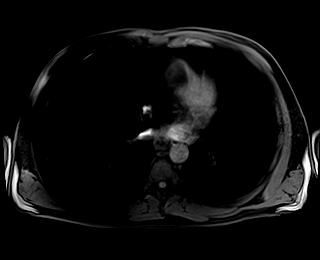

[Series 6: T1 dynamic post-contrast · axial · 3.0mm · 1.19mm/px · z∈[-183,+54]mm · 3 of 80 slices shown (1 of 9)]
[im 1/80]
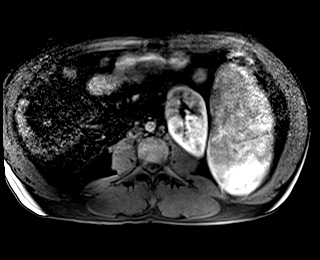
[im 40/80]
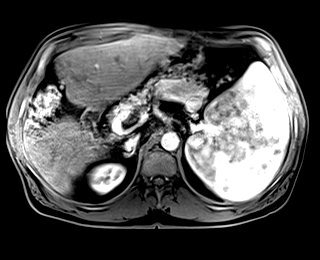
[im 80/80]
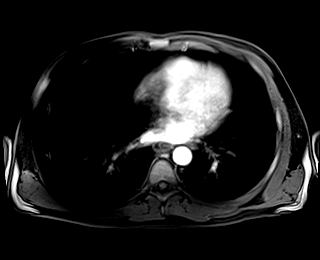

[Series 7: T1 dynamic post-contrast · axial · 3.0mm · 1.19mm/px · z∈[-183,+54]mm · 3 of 80 slices shown (2 of 9)]
[im 1/80]
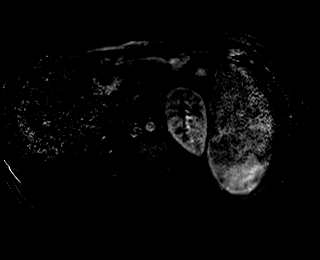
[im 40/80]
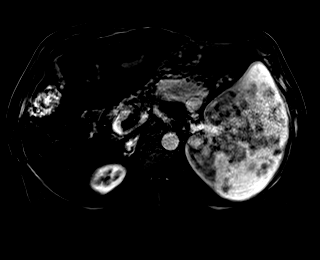
[im 80/80]
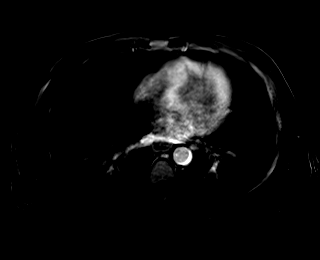

[Series 8: T1 dynamic post-contrast · axial · 3.0mm · 1.19mm/px · z∈[-183,+54]mm · 3 of 80 slices shown (3 of 9)]
[im 1/80]
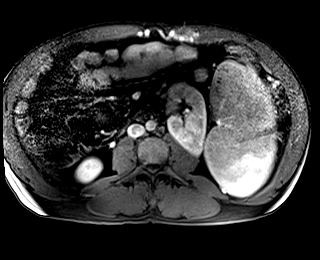
[im 40/80]
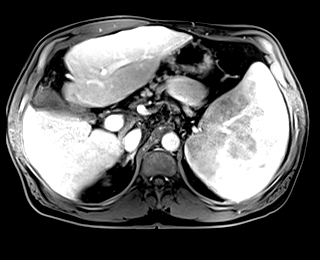
[im 80/80]
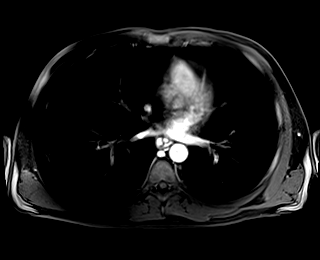

[Series 9: T1 dynamic post-contrast · axial · 3.0mm · 1.19mm/px · z∈[-183,+54]mm · 3 of 80 slices shown (4 of 9)]
[im 1/80]
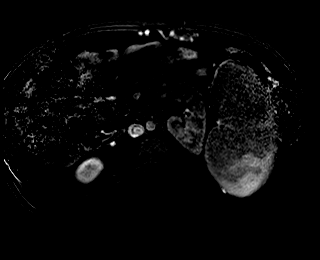
[im 40/80]
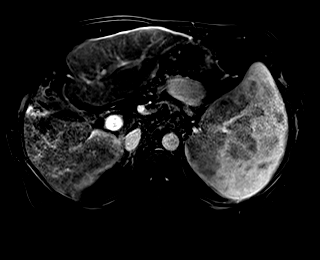
[im 80/80]
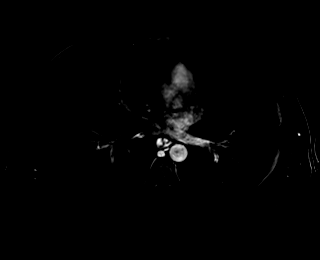

[Series 10: T1 dynamic post-contrast · axial · 3.0mm · 1.19mm/px · z∈[-183,+54]mm · 3 of 80 slices shown (5 of 9)]
[im 1/80]
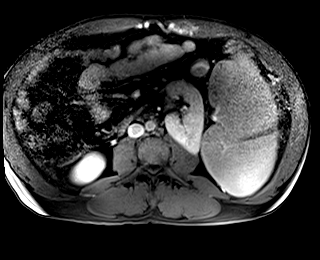
[im 40/80]
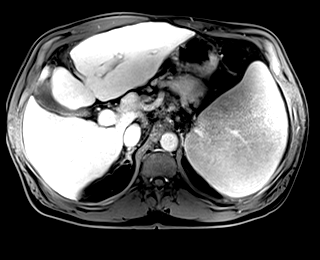
[im 80/80]
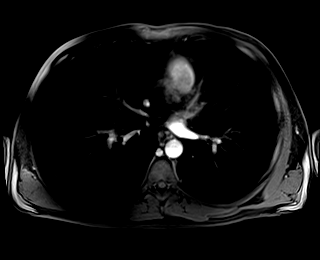

[Series 11: T1 dynamic post-contrast · axial · 3.0mm · 1.19mm/px · z∈[-183,+54]mm · 3 of 80 slices shown (6 of 9)]
[im 1/80]
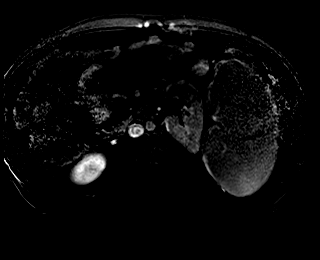
[im 40/80]
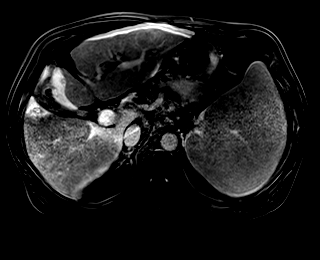
[im 80/80]
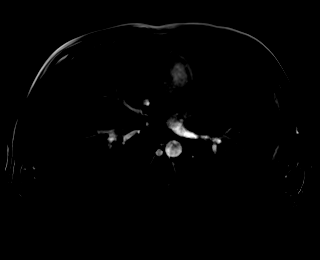

[Series 12: T1 dynamic post-contrast · coronal · 3.0mm · 1.25mm/px · 3 of 88 slices shown (7 of 9)]
[im 1/88]
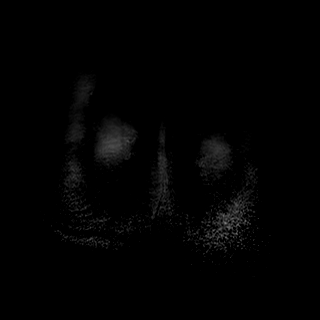
[im 44/88]
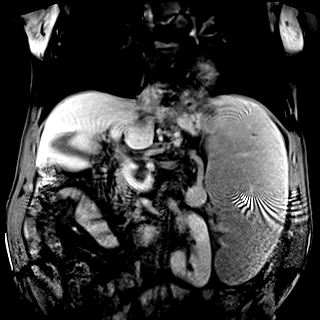
[im 88/88]
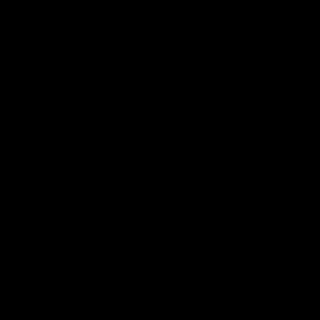

[Series 13: T2 · axial · 5.0mm · 1.48mm/px · z∈[-188,+34]mm · 2 of 38 slices shown (2 of 3)]
[im 1/38]
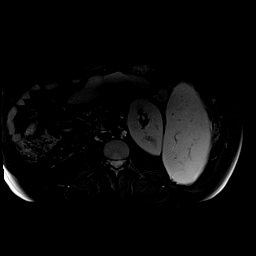
[im 38/38]
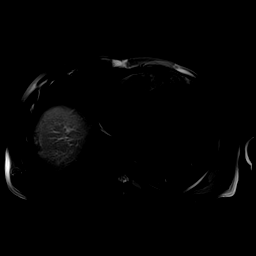

[Series 14: T2 · axial · 6.0mm · 1.22mm/px · 1 of 30 slices shown (3 of 3)]
[im 1/30]
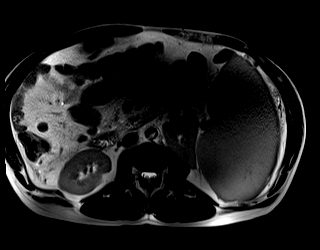

[Series 15: DWI · axial · 5.0mm · 1.42mm/px · z∈[-161,+49]mm · 4 of 108 slices shown (1 of 2)]
[im 1/108]
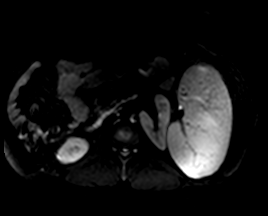
[im 36/108]
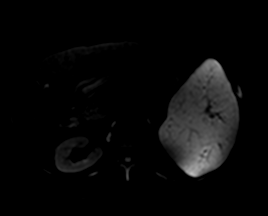
[im 72/108]
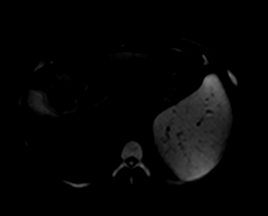
[im 108/108]
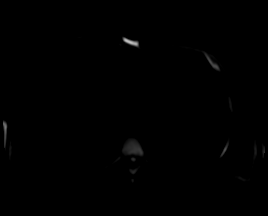

[Series 16: DWI · axial · 5.0mm · 1.42mm/px · 1 of 36 slices shown (2 of 2)]
[im 1/36]
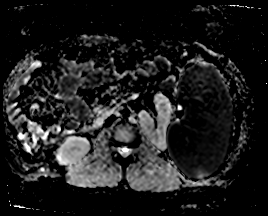

[Series 17: T1 dynamic post-contrast · axial · 3.0mm · 1.19mm/px · z∈[-183,+54]mm · 3 of 80 slices shown (8 of 9)]
[im 1/80]
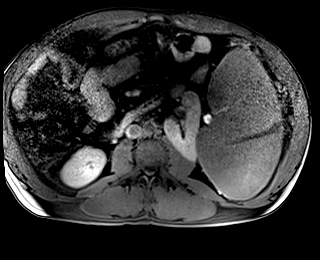
[im 40/80]
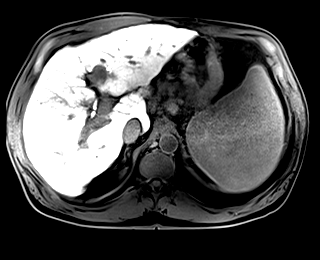
[im 80/80]
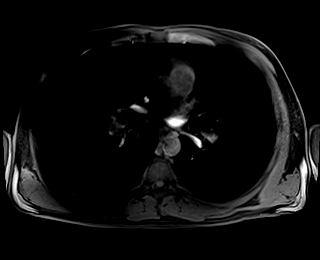

[Series 18: T1 dynamic post-contrast · axial · 3.0mm · 1.19mm/px · z∈[-183,+54]mm · 3 of 80 slices shown (9 of 9)]
[im 1/80]
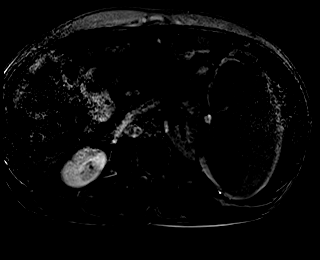
[im 40/80]
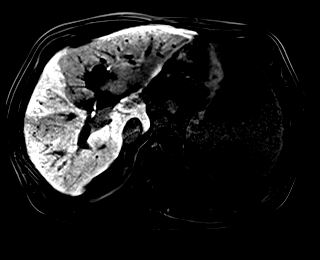
[im 80/80]
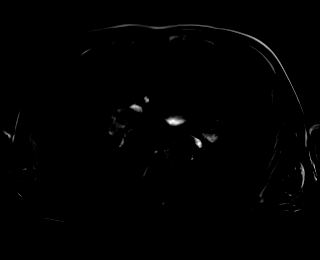

[48 of 48 positions shown; findings below may reference images not displayed]

FINDINGS: Lower chest: Lung bases without signs of effusion or evidence of
consolidative process.

Hepatobiliary: Area of subtle hypointensity on T1 anterior to the
porta hepatis may display some mild steatotic changes. Arterial
phase enhancement measuring 1.8 cm (image [DATE]) fissural widening of
the liver with LEFT lobe hypertrophy is similar to prior studies.
Arterial phase is mildly degraded secondary to respiratory motion.
No additional arterial enhancing foci are noted.

On equilibrium phase this area is nearly isointense to surrounding
liver. Only minimally hypointense on venous phase. Area is nearly
isointense on T2 weighted imaging and without signs of restricted
diffusion. On hepatobiliary phase near uniform isointensity to
adjacent liver with band of hypointensity tracking along the
posterior margin of the pre portal liver (image 43/17) no masslike
features exhibited on hepatobiliary phase.

Portal vein with nonocclusive moderately large thrombus at the
splenic portal confluence with engorgement of the splenic vein and
signs of extensive varices about the stomach and distal esophagus as
well as portosystemic collaterals bridging into the body wall, all
findings that were present on previous imaging.

Small cyst in the RIGHT hepatic lobe (image [DATE]) this is stable
compatible with a benign lesion.

No pericholecystic stranding or signs of biliary duct distension.
Small amount of sludge in the dependent gallbladder

Pancreas: Normal intrinsic T1 signal. No ductal dilation or sign of
inflammation. No focal lesion.

Spleen: Marked splenomegaly up to 23 cm greatest craniocaudal
dimension.

Adrenals/Urinary Tract: RIGHT kidney is normal. Imaged portions the
LEFT kidney are unremarkable. The kidney displaced inferiorly by
large spleen. Adrenal glands are normal.

Stomach/Bowel: Unremarkable to the extent evaluated on abdominal
MRI.

Vascular/Lymphatic: Portosystemic collaterals and signs of
splenic-portal venous thrombosis.

Other:  No ascites

Musculoskeletal: No suspicious bone lesions identified.
IMPRESSION: 1. Arterial phase enhancement in the pre portal central liver
without masslike features on hepatic biliary phase with central
bandlike area of hypointensity which may represent an area of
fibrosis or small vessel along the margin of the liver. Findings are
favored to represent transient intensity differences due to
perfusional anomaly, categorized as MEINER category 3 given more
focal appearance on the arterial phase. Three-month follow-up is
suggested to document stability with further imaging based on
follow-up assessment.
2. Signs of portal venous hypertension with abundant varices in the
stomach, distal esophagus and throughout the upper abdomen. Also
associated with more marked splenomegaly.
3. Signs of thrombus at the portal-splenic confluence extending into
portal vein, nonocclusive but with considerable amount of thrombus
in this location which appears unchanged compared to imaging from
Wednesday April, 2021.
4. Small amount of sludge in the dependent gallbladder.

## 2024-02-13 NOTE — Telephone Encounter (Signed)
 PT girlfriend is calling to reschedule EGD/colon. She stated that the insurance should be in network now. Please advise of the hospitals schedule.

## 2024-02-18 NOTE — Telephone Encounter (Signed)
 Patient scheduled for next opening with Dr. Stacia on December 2nd. Called patient and spoke to Colton Leon the patient's significant other. They were at the hospital undergoing testing for his liver transplant. I let her know Dr. Stacia would like to see him in the office for reassessment and discuss scheduling hosp procedure(s).  She said she would call back to schedule/confirm his appointment when they are done at the hospital

## 2024-04-22 ENCOUNTER — Ambulatory Visit: Admitting: Gastroenterology
